# Patient Record
Sex: Female | Born: 1990 | Race: White | Hispanic: No | Marital: Married | Smoking: Former smoker
Health system: Southern US, Community
[De-identification: ages and names within clinical notes are randomized; demographics above are authoritative.]

## PROBLEM LIST (undated history)

## (undated) ENCOUNTER — Inpatient Hospital Stay (HOSPITAL_COMMUNITY): Payer: Self-pay

## (undated) DIAGNOSIS — Z973 Presence of spectacles and contact lenses: Secondary | ICD-10-CM

## (undated) DIAGNOSIS — F909 Attention-deficit hyperactivity disorder, unspecified type: Secondary | ICD-10-CM

## (undated) DIAGNOSIS — R222 Localized swelling, mass and lump, trunk: Secondary | ICD-10-CM

## (undated) DIAGNOSIS — F419 Anxiety disorder, unspecified: Secondary | ICD-10-CM

## (undated) DIAGNOSIS — Z789 Other specified health status: Secondary | ICD-10-CM

## (undated) DIAGNOSIS — F32A Depression, unspecified: Secondary | ICD-10-CM

## (undated) HISTORY — PX: NO PAST SURGERIES: SHX2092

---
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
## 2012-06-22 ENCOUNTER — Other Ambulatory Visit (HOSPITAL_COMMUNITY)
Admission: RE | Admit: 2012-06-22 | Discharge: 2012-06-22 | Disposition: A | Discharge status: Home / Self Care | Payer: Medicaid Other | Source: Ambulatory Visit | Attending: Obstetrics & Gynecology | Admitting: Obstetrics & Gynecology

## 2012-06-22 DIAGNOSIS — Z113 Encounter for screening for infections with a predominantly sexual mode of transmission: Secondary | ICD-10-CM | POA: Insufficient documentation

## 2012-06-22 DIAGNOSIS — Z3400 Encounter for supervision of normal first pregnancy, unspecified trimester: Secondary | ICD-10-CM | POA: Insufficient documentation

## 2012-06-22 DIAGNOSIS — Z124 Encounter for screening for malignant neoplasm of cervix: Secondary | ICD-10-CM | POA: Insufficient documentation

## 2012-06-27 ENCOUNTER — Other Ambulatory Visit: Payer: Self-pay

## 2012-08-16 ENCOUNTER — Other Ambulatory Visit (HOSPITAL_COMMUNITY): Payer: Self-pay | Admitting: Obstetrics & Gynecology

## 2012-08-16 ENCOUNTER — Ambulatory Visit (HOSPITAL_COMMUNITY)
Admission: RE | Admit: 2012-08-16 | Discharge: 2012-08-16 | Disposition: A | Discharge status: Home / Self Care | Payer: Medicaid Other | Source: Ambulatory Visit | Attending: Obstetrics & Gynecology | Admitting: Obstetrics & Gynecology

## 2012-08-16 DIAGNOSIS — K7689 Other specified diseases of liver: Secondary | ICD-10-CM | POA: Insufficient documentation

## 2012-08-16 DIAGNOSIS — K8020 Calculus of gallbladder without cholecystitis without obstruction: Secondary | ICD-10-CM

## 2012-08-16 DIAGNOSIS — R1011 Right upper quadrant pain: Secondary | ICD-10-CM

## 2012-09-21 ENCOUNTER — Encounter (HOSPITAL_COMMUNITY): Payer: Self-pay | Admitting: Anesthesiology

## 2012-09-21 ENCOUNTER — Inpatient Hospital Stay (HOSPITAL_COMMUNITY): Payer: Medicaid Other

## 2012-09-21 ENCOUNTER — Inpatient Hospital Stay (HOSPITAL_COMMUNITY)
Admission: AD | Admit: 2012-09-21 | Discharge: 2012-09-26 | DRG: 765 | Disposition: A | Discharge status: Home / Self Care | Payer: Medicaid Other | Source: Ambulatory Visit | Attending: Obstetrics & Gynecology | Admitting: Obstetrics & Gynecology

## 2012-09-21 ENCOUNTER — Encounter (HOSPITAL_COMMUNITY): Payer: Self-pay | Admitting: *Deleted

## 2012-09-21 DIAGNOSIS — O4290 Premature rupture of membranes, unspecified as to length of time between rupture and onset of labor, unspecified weeks of gestation: Secondary | ICD-10-CM | POA: Diagnosis present

## 2012-09-21 DIAGNOSIS — E669 Obesity, unspecified: Secondary | ICD-10-CM | POA: Diagnosis present

## 2012-09-21 DIAGNOSIS — O3430 Maternal care for cervical incompetence, unspecified trimester: Secondary | ICD-10-CM | POA: Diagnosis present

## 2012-09-21 DIAGNOSIS — O328XX0 Maternal care for other malpresentation of fetus, not applicable or unspecified: Principal | ICD-10-CM | POA: Diagnosis present

## 2012-09-21 DIAGNOSIS — O99214 Obesity complicating childbirth: Secondary | ICD-10-CM | POA: Diagnosis present

## 2012-09-21 HISTORY — DX: Other specified health status: Z78.9

## 2012-09-21 LAB — CBC
MCH: 29.5 pg (ref 26.0–34.0)
MCHC: 34 g/dL (ref 30.0–36.0)
MCV: 86.8 fL (ref 78.0–100.0)
Platelets: 233 10*3/uL (ref 150–400)
RDW: 13.4 % (ref 11.5–15.5)

## 2012-09-21 LAB — URINALYSIS, ROUTINE W REFLEX MICROSCOPIC
Glucose, UA: NEGATIVE mg/dL
Specific Gravity, Urine: 1.015 (ref 1.005–1.030)
pH: 6.5 (ref 5.0–8.0)

## 2012-09-21 LAB — URINE MICROSCOPIC-ADD ON

## 2012-09-21 LAB — POCT FERN TEST: Fern Test: NEGATIVE

## 2012-09-21 MED ORDER — ZOLPIDEM TARTRATE 5 MG PO TABS
5.0000 mg | ORAL_TABLET | Freq: Every evening | ORAL | Status: DC | PRN
  Administered 2012-09-21: 5 mg via ORAL
  Filled 2012-09-21: qty 1

## 2012-09-21 MED ORDER — DOCUSATE SODIUM 100 MG PO CAPS
100.0000 mg | ORAL_CAPSULE | Freq: Every day | ORAL | Status: DC
  Administered 2012-09-23 – 2012-09-26 (×4): 100 mg via ORAL
  Filled 2012-09-21 (×4): qty 1

## 2012-09-21 MED ORDER — BETAMETHASONE SOD PHOS & ACET 6 (3-3) MG/ML IJ SUSP
12.0000 mg | INTRAMUSCULAR | Status: AC
  Filled 2012-09-21: qty 2

## 2012-09-21 MED ORDER — PRENATAL MULTIVITAMIN CH: 1.0000 | ORAL_TABLET | Freq: Every day | ORAL | Status: DC

## 2012-09-21 MED ORDER — CEFAZOLIN SODIUM 1-5 GM-% IV SOLN
1.0000 g | Freq: Three times a day (TID) | INTRAVENOUS | Status: DC
  Administered 2012-09-22 – 2012-09-23 (×4): 1 g via INTRAVENOUS
  Filled 2012-09-21 (×7): qty 50

## 2012-09-21 MED ORDER — CEFAZOLIN SODIUM-DEXTROSE 2-3 GM-% IV SOLR: 2.0000 g | Freq: Once | INTRAVENOUS | Status: DC

## 2012-09-21 MED ORDER — ACETAMINOPHEN 325 MG PO TABS
650.0000 mg | ORAL_TABLET | ORAL | Status: DC | PRN
  Administered 2012-09-21 – 2012-09-22 (×2): 650 mg via ORAL
  Filled 2012-09-21 (×2): qty 2

## 2012-09-21 MED ORDER — CALCIUM CARBONATE ANTACID 500 MG PO CHEW
2.0000 | CHEWABLE_TABLET | ORAL | Status: DC | PRN
  Administered 2012-09-23: 400 mg via ORAL
  Filled 2012-09-21 (×2): qty 1

## 2012-09-21 MED ORDER — MAGNESIUM SULFATE BOLUS VIA INFUSION
6.0000 g | Freq: Once | INTRAVENOUS | Status: AC
  Administered 2012-09-21: 6 g via INTRAVENOUS
  Filled 2012-09-21: qty 500

## 2012-09-21 MED ORDER — BETAMETHASONE SOD PHOS & ACET 6 (3-3) MG/ML IJ SUSP
12.0000 mg | Freq: Once | INTRAMUSCULAR | Status: AC
  Administered 2012-09-21: 12 mg via INTRAMUSCULAR
  Filled 2012-09-21: qty 2

## 2012-09-21 MED ORDER — MAGNESIUM SULFATE 40 G IN LACTATED RINGERS - SIMPLE
2.0000 g/h | INTRAVENOUS | Status: DC
  Administered 2012-09-21 – 2012-09-22 (×2): 2 g/h via INTRAVENOUS
  Filled 2012-09-21 (×2): qty 500

## 2012-09-21 MED ORDER — LACTATED RINGERS IV SOLN
INTRAVENOUS | Status: DC
  Administered 2012-09-21 – 2012-09-22 (×2): via INTRAVENOUS
  Administered 2012-09-22: 100 mL/h via INTRAVENOUS
  Administered 2012-09-22 (×4): via INTRAVENOUS

## 2012-09-21 MED ORDER — CEFAZOLIN SODIUM-DEXTROSE 2-3 GM-% IV SOLR
2.0000 g | Freq: Once | INTRAVENOUS | Status: AC
  Administered 2012-09-21: 2 g via INTRAVENOUS
  Filled 2012-09-21: qty 50

## 2012-09-21 NOTE — H&P (Signed)
Wanda Mccann is a 21 y.o. female G1 and is 20 w 5d by [facility] by 11 week [place] of 01-14-12 after unsure LMP EDC of 2-14-14presenting for evaluation and treatment and called earlier that she had visited an ER yesterday evening after she fell on her buttocks and noticed no increase in pain or bleeding and states she has not had any decrease in fetal movement.  In MAU she has been evaluated per CNM and found to have membranes in the vagina.  The patient has had minimal to mild cramping.    The patient initially stated she would do all that she could for this baby.  This was in light of the possible need for a Cesarean section for abnormal FHR or ROM  And breech presentation  We did discuss positioning and probable poor outcome and use of magnesium and antibiotics and lung maturation steroids and neonatal consultation and MFM consultation and Dr Claudean Severance was consulted by phone.  Maternal Medical History:  Reason for admission: Reason for admission: contractions.  Reason for Admission:   nauseaContractions: Onset was 6-12 hours ago.   Frequency: irregular.   Perceived severity is mild.    Fetal activity: Perceived fetal activity is normal.   Last perceived fetal movement was within the past hour.    Prenatal complications: no prenatal complications No bleeding, cholelithiasis, HIV, hypertension, infection, IUGR, nephrolithiasis, oligohydramnios, placental abnormality, polyhydramnios, pre-eclampsia, preterm labor, substance abuse, thrombocytopenia or thrombophilia.   Prenatal Complications - Diabetes: none.    OB History    Grav Para Term Preterm Abortions TAB SAB Ect Mult Living   1              Past Medical History  Diagnosis Date  . No pertinent past medical history   headaches and obesity Past Surgical History  Procedure Date  . No past surgeries    Family History: family history is not on file. not pertinent to this but no h/o DVT PE or stroke Social History:  reports that she has  quit smoking. She does not have any smokeless tobacco history on file. She reports that she does not drink alcohol or use illicit drugs.   Prenatal Transfer Tool  Maternal Diabetes: No Genetic Screening: Normal Maternal Ultrasounds/Referrals: Normal Fetal Ultrasounds or other Referrals:  None Maternal Substance Abuse:  No Significant Maternal Medications:  None Significant Maternal Lab Results:  None Other Comments:  None  Review of Systems  Constitutional: Negative.  Negative for fever, chills, weight loss, malaise/fatigue and diaphoresis.  HENT: Negative.  Negative for congestion and sore throat.        Has had a h/o HA this pregnancy but not currently  Eyes: Negative.  Negative for blurred vision.  Respiratory: Negative.  Negative for cough, hemoptysis, sputum production, shortness of breath and wheezing.   Cardiovascular: Negative.  Negative for chest pain, palpitations and leg swelling.  Gastrointestinal: Positive for heartburn and abdominal pain. Negative for nausea, vomiting, diarrhea, constipation and blood in stool.  Genitourinary: Negative.  Negative for dysuria, urgency and flank pain.  Musculoskeletal: Positive for back pain.       The pain that she has been experiencing sounds like contractions that are mild occasionally radiating to back that comes and goes   Skin: Negative.  Negative for rash.  Neurological: Negative.  Negative for tremors, sensory change, speech change, focal weakness, seizures, loss of consciousness and weakness.  Endo/Heme/Allergies: Negative.   Psychiatric/Behavioral: Negative.       Blood pressure 142/75, pulse  91, temperature 98.8 F (37.1 C), temperature source Oral, resp. rate 20, height 5\' 9"  (1.753 m), weight 147.419 kg (325 lb). Maternal Exam:  Uterine Assessment: Contraction strength is mild.  Not evident by palpation or on the monitor maybe secondary to body habitus and maybe secondary to the fact that they are only mild     Physical  Exam  Constitutional: She appears distressed.       Mildly distressed and upset and in Trendelenberg positoning in MAU   HENT:  Head: Normocephalic and atraumatic.  Mouth/Throat: Oropharynx is clear and moist.  Eyes: Pupils are equal, round, and reactive to light. Right eye exhibits no discharge. Left eye exhibits no discharge.  Neck: Normal range of motion. Neck supple. No tracheal deviation present. No thyromegaly present.  Cardiovascular: Normal rate, regular rhythm and normal heart sounds.   Respiratory: Effort normal and breath sounds normal. No respiratory distress. She has no wheezes.  GI: Soft. Bowel sounds are normal. She exhibits no distension. There is tenderness. There is no rebound and no guarding.       Mild suprapubic tenderness and not acute and not more than mild  Genitourinary:       Pt had been examined and [place] was already performed with the finding of minimal cervix, membranes in the vagina and breech  Musculoskeletal: She exhibits no edema and no tenderness.  Lymphadenopathy:    She has no cervical adenopathy.  Neurological: She is alert. She has normal reflexes.  Skin: Skin is warm and dry. No rash noted. She is not diaphoretic. No erythema.  Psychiatric: She has a normal mood and affect. Her behavior is normal. Judgment and thought content normal.    Prenatal labs: ABO, Rh: --/--/A NEG, A NEG (10/17 1935) [name] postive Antibody: NEG (10/17 1935) Rubella:  Immune RPR:   negative HBsAg:   negative HIV:   negative GBS:   pending  Assessment/Plan: IUP at [redacted]w[redacted]d Cervical insufficiency Prolapsed membranes Morbid obesity   Plan: Steroids and magnesium and antibiotics and rest and trendelenberg for now I have been informed that a NICU bed would be available and that a L&D bed may not be available The patient initially stated she would do all that she could for this baby.  This was in light of the possible need for a Cesarean section for abnormal FHR or ROM  And  breech presentation  We did discuss positioning and probable poor outcome and use of magnesium and antibiotics and lung maturation steroids and neonatal consultation and MFM consultation and Dr Claudean Severance was consulted by phone.  Eller Sweis H. 09/21/2012, 9:44 PM

## 2012-09-21 NOTE — MAU Note (Signed)
Pt stated she fell in ht shower yesterday fell on her bottom. Reports had been progressively having lower abd cramping that is sharp. Reports normal fetal movement.

## 2012-09-21 NOTE — MAU Provider Note (Signed)
Chief Complaint:  Abdominal Pain   First Provider Initiated Contact with Patient 09/21/12 1745     HPI: Wanda Mccann is a 21 y.o. G1P0 at 54w5dwho presents to maternity admissions reporting worsening sharp cramping since falling in the shower yesterday. States she fell on her bottom. Denies leakage of fluid or vaginal bleeding. Good fetal movement.   Past Medical History: Past Medical History  Diagnosis Date  . No pertinent past medical history     Past obstetric history: OB History    Grav Para Term Preterm Abortions TAB SAB Ect Mult Living   1              # Outc Date GA Lbr Len/2nd Wgt Sex Del Anes PTL Lv   1 CUR               Past Surgical History: Past Surgical History  Procedure Date  . No past surgeries     Family History: No family history on file.  Social History: History  Substance Use Topics  . Smoking status: Former Games developer  . Smokeless tobacco: Not on file  . Alcohol Use: No    Allergies:  Allergies  Allergen Reactions  . Penicillins Rash    Meds:  Prescriptions prior to admission  Medication Sig Dispense Refill  . acetaminophen (TYLENOL) 500 MG tablet Take 500 mg by mouth every 6 (six) hours as needed. Takes for pain      . Prenatal Vit-Fe Fumarate-FA (PRENATAL MULTIVITAMIN) TABS Take 1 tablet by mouth every morning.        ROS: Pertinent findings in history of present illness.  Physical Exam  Blood pressure 145/78, pulse 104, temperature 98.9 F (37.2 C), temperature source Oral, resp. rate 18, height 5\' 9"  (1.753 m).  GENERAL: Well-developed, well-nourished female in no acute distress.  HEENT: normocephalic HEART: normal rate RESP: normal effort ABDOMEN: Soft, non-tender, gravid appropriate for gestational age EXTREMITIES: Nontender, no edema NEURO: alert and oriented SPECULUM EXAM: NEFG, moderate amount of frothy, mucoid yellow discharge. BBOW seen. No bleeding.  BBOW felt. No cervix felt.   FHT:  Baseline 140 , moderate  variability, accelerations present, no decelerations Contractions: none   Labs: Results for orders placed during the hospital encounter of 09/21/12 (from the past 24 hour(s))  URINALYSIS, ROUTINE W REFLEX MICROSCOPIC     Status: Abnormal   Collection Time   09/21/12  5:15 PM      Component Value Range   Color, Urine YELLOW  YELLOW   APPearance CLEAR  CLEAR   Specific Gravity, Urine 1.015  1.005 - 1.030   pH 6.5  5.0 - 8.0   Glucose, UA NEGATIVE  NEGATIVE mg/dL   Hgb urine dipstick NEGATIVE  NEGATIVE   Bilirubin Urine NEGATIVE  NEGATIVE   Ketones, ur NEGATIVE  NEGATIVE mg/dL   Protein, ur NEGATIVE  NEGATIVE mg/dL   Urobilinogen, UA 0.2  0.0 - 1.0 mg/dL   Nitrite NEGATIVE  NEGATIVE   Leukocytes, UA LARGE (*) NEGATIVE  URINE MICROSCOPIC-ADD ON     Status: Abnormal   Collection Time   09/21/12  5:15 PM      Component Value Range   Squamous Epithelial / LPF MANY (*) RARE   WBC, UA 11-20  <3 WBC/hpf   Bacteria, UA FEW (*) RARE   Urine-Other AMORPHOUS URATES/PHOSPHATES    WET PREP, GENITAL     Status: Abnormal   Collection Time   09/21/12  5:50 PM      Component  Value Range   Yeast Wet Prep HPF POC NONE SEEN  NONE SEEN   Trich, Wet Prep NONE SEEN  NONE SEEN   Clue Cells Wet Prep HPF POC NONE SEEN  NONE SEEN   WBC, Wet Prep HPF POC FEW (*) NONE SEEN    Imaging:  No CL, membranes bulging into vagina, footling breech. EFW 1-14, measuring 26.1 weeks, normal fluid.  ED Course 1815: Notified Dr. Christell Constant of BBOW on exam, orders for BMZ, Magnesium Sulfate, BS [place]. Order Ancef. Will be here to see pt in ~1 hour. NICU notified of pt exam. 1840: Dr. Christell Constant at [facility]. By prelim [place] reports baby measuring 26 weeks, ~2 lb. BMZ #1 given. RN's trying to start IV. Will call Nurse Anesthetist.   Assessment: 1. Preterm labor without delivery in second trimester   2. Incompetent cervix in pregnancy, antepartum     Plan: Admit to Antenatal. Ancef, Magnesium Sulfate, NICU Consult, CBC, RPR,  T&S. Anesthesia and NICU notified.   [place], CNM 09/21/2012 7:23 PM

## 2012-09-22 ENCOUNTER — Inpatient Hospital Stay (HOSPITAL_COMMUNITY): Payer: Medicaid Other | Admitting: Anesthesiology

## 2012-09-22 ENCOUNTER — Encounter (HOSPITAL_COMMUNITY): Payer: Self-pay | Admitting: Anesthesiology

## 2012-09-22 ENCOUNTER — Inpatient Hospital Stay (HOSPITAL_COMMUNITY)
Admit: 2012-09-22 | Discharge: 2012-09-22 | Disposition: A | Discharge status: Home / Self Care | Payer: Medicaid Other | Attending: Obstetrics & Gynecology | Admitting: Obstetrics & Gynecology

## 2012-09-22 ENCOUNTER — Inpatient Hospital Stay (HOSPITAL_COMMUNITY): Payer: Medicaid Other

## 2012-09-22 ENCOUNTER — Encounter (HOSPITAL_COMMUNITY)
Admission: AD | Disposition: A | Discharge status: Home / Self Care | Payer: Self-pay | Source: Ambulatory Visit | Attending: Obstetrics & Gynecology

## 2012-09-22 ENCOUNTER — Encounter (HOSPITAL_COMMUNITY): Payer: Self-pay | Admitting: *Deleted

## 2012-09-22 LAB — RPR: RPR Ser Ql: NONREACTIVE

## 2012-09-22 LAB — GC/CHLAMYDIA PROBE AMP, GENITAL: Chlamydia, DNA Probe: NEGATIVE

## 2012-09-22 SURGERY — Surgical Case
Anesthesia: Spinal | Site: Abdomen | Wound class: Clean Contaminated

## 2012-09-22 MED ORDER — BUPIVACAINE HCL 0.5 % IJ SOLN
INTRAMUSCULAR | Status: DC | PRN
  Administered 2012-09-22: 30 mL

## 2012-09-22 MED ORDER — MORPHINE SULFATE (PF) 0.5 MG/ML IJ SOLN
INTRAMUSCULAR | Status: DC | PRN
  Administered 2012-09-22: .2 mg via INTRATHECAL

## 2012-09-22 MED ORDER — KETOROLAC TROMETHAMINE 30 MG/ML IJ SOLN: 30.0000 mg | Freq: Four times a day (QID) | INTRAMUSCULAR | Status: AC | PRN

## 2012-09-22 MED ORDER — HYDROMORPHONE HCL PF 1 MG/ML IJ SOLN
INTRAMUSCULAR | Status: AC
  Filled 2012-09-22: qty 1

## 2012-09-22 MED ORDER — OXYTOCIN 10 UNIT/ML IJ SOLN
INTRAMUSCULAR | Status: AC
  Filled 2012-09-22: qty 1

## 2012-09-22 MED ORDER — MAGNESIUM HYDROXIDE 400 MG/5ML PO SUSP: 30.0000 mL | ORAL | Status: DC | PRN

## 2012-09-22 MED ORDER — MEPERIDINE HCL 25 MG/ML IJ SOLN: 6.2500 mg | INTRAMUSCULAR | Status: DC | PRN

## 2012-09-22 MED ORDER — FENTANYL CITRATE 0.05 MG/ML IJ SOLN
INTRAMUSCULAR | Status: DC | PRN
  Administered 2012-09-22: 12.5 ug via INTRATHECAL

## 2012-09-22 MED ORDER — SODIUM CHLORIDE 0.9 % IJ SOLN: 3.0000 mL | INTRAMUSCULAR | Status: DC | PRN

## 2012-09-22 MED ORDER — FENTANYL CITRATE 0.05 MG/ML IJ SOLN
INTRAMUSCULAR | Status: AC
  Filled 2012-09-22: qty 2

## 2012-09-22 MED ORDER — FENTANYL CITRATE 0.05 MG/ML IJ SOLN
INTRAMUSCULAR | Status: DC | PRN
  Administered 2012-09-22: 87.5 ug via INTRAVENOUS
  Administered 2012-09-22: 50 ug via INTRAVENOUS
  Administered 2012-09-22 (×5): 100 ug via INTRAVENOUS

## 2012-09-22 MED ORDER — MENTHOL 3 MG MT LOZG: 1.0000 | LOZENGE | OROMUCOSAL | Status: DC | PRN

## 2012-09-22 MED ORDER — PRENATAL MULTIVITAMIN CH
1.0000 | ORAL_TABLET | Freq: Every day | ORAL | Status: DC
  Administered 2012-09-23 – 2012-09-26 (×4): 1 via ORAL
  Filled 2012-09-22 (×4): qty 1

## 2012-09-22 MED ORDER — SCOPOLAMINE 1 MG/3DAYS TD PT72
MEDICATED_PATCH | TRANSDERMAL | Status: AC
  Filled 2012-09-22: qty 1

## 2012-09-22 MED ORDER — SIMETHICONE 80 MG PO CHEW: 80.0000 mg | CHEWABLE_TABLET | ORAL | Status: DC | PRN

## 2012-09-22 MED ORDER — ONDANSETRON HCL 4 MG/2ML IJ SOLN
INTRAMUSCULAR | Status: AC
  Filled 2012-09-22: qty 2

## 2012-09-22 MED ORDER — TETANUS-DIPHTH-ACELL PERTUSSIS 5-2.5-18.5 LF-MCG/0.5 IM SUSP
0.5000 mL | Freq: Once | INTRAMUSCULAR | Status: AC
  Administered 2012-09-25: 0.5 mL via INTRAMUSCULAR
  Filled 2012-09-22: qty 0.5

## 2012-09-22 MED ORDER — MIDAZOLAM HCL 5 MG/5ML IJ SOLN
INTRAMUSCULAR | Status: DC | PRN
  Administered 2012-09-22: 2 mg via INTRAVENOUS

## 2012-09-22 MED ORDER — ONDANSETRON HCL 4 MG/2ML IJ SOLN: 4.0000 mg | INTRAMUSCULAR | Status: DC | PRN

## 2012-09-22 MED ORDER — NALBUPHINE HCL 10 MG/ML IJ SOLN: 5.0000 mg | INTRAMUSCULAR | Status: DC | PRN

## 2012-09-22 MED ORDER — HYDROMORPHONE HCL PF 1 MG/ML IJ SOLN: 0.2500 mg | INTRAMUSCULAR | Status: DC | PRN

## 2012-09-22 MED ORDER — MORPHINE SULFATE 0.5 MG/ML IJ SOLN
INTRAMUSCULAR | Status: AC
  Filled 2012-09-22: qty 10

## 2012-09-22 MED ORDER — DIPHENHYDRAMINE HCL 50 MG/ML IJ SOLN: 25.0000 mg | INTRAMUSCULAR | Status: DC | PRN

## 2012-09-22 MED ORDER — CITRIC ACID-SODIUM CITRATE 334-500 MG/5ML PO SOLN
ORAL | Status: AC
  Administered 2012-09-22: 30 mL
  Filled 2012-09-22: qty 15

## 2012-09-22 MED ORDER — OXYTOCIN 40 UNITS IN LACTATED RINGERS INFUSION - SIMPLE MED: 62.5000 mL/h | INTRAVENOUS | Status: AC

## 2012-09-22 MED ORDER — KETAMINE HCL 100 MG/ML IJ SOLN
INTRAMUSCULAR | Status: AC
  Filled 2012-09-22: qty 1

## 2012-09-22 MED ORDER — OXYCODONE-ACETAMINOPHEN 5-325 MG PO TABS
1.0000 | ORAL_TABLET | ORAL | Status: DC | PRN
  Administered 2012-09-23 – 2012-09-24 (×3): 2 via ORAL
  Administered 2012-09-24: 1 via ORAL
  Administered 2012-09-25 – 2012-09-26 (×2): 2 via ORAL
  Filled 2012-09-22: qty 2
  Filled 2012-09-22: qty 1
  Filled 2012-09-22 (×4): qty 2

## 2012-09-22 MED ORDER — KETOROLAC TROMETHAMINE 30 MG/ML IJ SOLN: 15.0000 mg | Freq: Once | INTRAMUSCULAR | Status: DC | PRN

## 2012-09-22 MED ORDER — BUPIVACAINE IN DEXTROSE 0.75-8.25 % IT SOLN
INTRATHECAL | Status: DC | PRN
  Administered 2012-09-22: 1.6 mL via INTRATHECAL

## 2012-09-22 MED ORDER — FENTANYL CITRATE 0.05 MG/ML IJ SOLN
INTRAMUSCULAR | Status: AC
  Filled 2012-09-22: qty 5

## 2012-09-22 MED ORDER — KETAMINE HCL 10 MG/ML IJ SOLN
INTRAMUSCULAR | Status: DC | PRN
Start: 2012-09-22 — End: 2012-09-22
  Administered 2012-09-22: 30 mg via INTRAVENOUS
  Administered 2012-09-22 (×2): 20 mg via INTRAVENOUS
  Administered 2012-09-22: 30 mg via INTRAVENOUS
  Administered 2012-09-22: 20 mg via INTRAVENOUS

## 2012-09-22 MED ORDER — KETOROLAC TROMETHAMINE 60 MG/2ML IM SOLN: 60.0000 mg | Freq: Once | INTRAMUSCULAR | Status: AC | PRN

## 2012-09-22 MED ORDER — NALOXONE HCL 0.4 MG/ML IJ SOLN: 0.4000 mg | INTRAMUSCULAR | Status: DC | PRN

## 2012-09-22 MED ORDER — DIBUCAINE 1 % RE OINT: 1.0000 "application " | TOPICAL_OINTMENT | RECTAL | Status: DC | PRN

## 2012-09-22 MED ORDER — SCOPOLAMINE 1 MG/3DAYS TD PT72
1.0000 | MEDICATED_PATCH | Freq: Once | TRANSDERMAL | Status: AC
  Administered 2012-09-22: 1.5 mg via TRANSDERMAL

## 2012-09-22 MED ORDER — METOCLOPRAMIDE HCL 5 MG/ML IJ SOLN
INTRAMUSCULAR | Status: AC
  Administered 2012-09-22: 10 mg via INTRAVENOUS
  Filled 2012-09-22: qty 2

## 2012-09-22 MED ORDER — IBUPROFEN 600 MG PO TABS
600.0000 mg | ORAL_TABLET | Freq: Four times a day (QID) | ORAL | Status: DC
  Administered 2012-09-23 – 2012-09-26 (×13): 600 mg via ORAL
  Filled 2012-09-22 (×13): qty 1

## 2012-09-22 MED ORDER — MORPHINE SULFATE (PF) 0.5 MG/ML IJ SOLN
INTRAMUSCULAR | Status: DC | PRN
  Administered 2012-09-22: 1 mg via INTRAVENOUS
  Administered 2012-09-22: .8 mg via EPIDURAL
  Administered 2012-09-22: 1 mg via EPIDURAL
  Administered 2012-09-22 (×2): 1 mg via INTRAVENOUS

## 2012-09-22 MED ORDER — OXYTOCIN 10 UNIT/ML IJ SOLN
40.0000 [IU] | INTRAVENOUS | Status: DC | PRN
  Administered 2012-09-22: 40 [IU] via INTRAVENOUS

## 2012-09-22 MED ORDER — EPHEDRINE SULFATE 50 MG/ML IJ SOLN
INTRAMUSCULAR | Status: DC | PRN
  Administered 2012-09-22: 10 mg via INTRAVENOUS

## 2012-09-22 MED ORDER — DIPHENHYDRAMINE HCL 25 MG PO CAPS
25.0000 mg | ORAL_CAPSULE | ORAL | Status: DC | PRN
  Administered 2012-09-24 (×2): 25 mg via ORAL
  Filled 2012-09-22: qty 1

## 2012-09-22 MED ORDER — ONDANSETRON HCL 4 MG/2ML IJ SOLN
4.0000 mg | Freq: Three times a day (TID) | INTRAMUSCULAR | Status: DC | PRN
  Administered 2012-09-22: 4 mg via INTRAVENOUS
  Filled 2012-09-22: qty 2

## 2012-09-22 MED ORDER — SIMETHICONE 80 MG PO CHEW
80.0000 mg | CHEWABLE_TABLET | Freq: Three times a day (TID) | ORAL | Status: DC
  Administered 2012-09-23 – 2012-09-26 (×13): 80 mg via ORAL

## 2012-09-22 MED ORDER — WITCH HAZEL-GLYCERIN EX PADS: 1.0000 "application " | MEDICATED_PAD | CUTANEOUS | Status: DC | PRN

## 2012-09-22 MED ORDER — MIDAZOLAM HCL 2 MG/2ML IJ SOLN
INTRAMUSCULAR | Status: AC
  Filled 2012-09-22: qty 2

## 2012-09-22 MED ORDER — PHENYLEPHRINE HCL 10 MG/ML IJ SOLN
INTRAMUSCULAR | Status: DC | PRN
  Administered 2012-09-22: 80 ug via INTRAVENOUS
  Administered 2012-09-22 (×3): 40 ug via INTRAVENOUS

## 2012-09-22 MED ORDER — PROMETHAZINE HCL 25 MG/ML IJ SOLN: 6.2500 mg | INTRAMUSCULAR | Status: DC | PRN

## 2012-09-22 MED ORDER — ONDANSETRON HCL 4 MG PO TABS: 4.0000 mg | ORAL_TABLET | ORAL | Status: DC | PRN

## 2012-09-22 MED ORDER — DIPHENHYDRAMINE HCL 25 MG PO CAPS
25.0000 mg | ORAL_CAPSULE | Freq: Four times a day (QID) | ORAL | Status: DC | PRN
  Filled 2012-09-22: qty 1

## 2012-09-22 MED ORDER — ONDANSETRON HCL 4 MG/2ML IJ SOLN
INTRAMUSCULAR | Status: DC | PRN
  Administered 2012-09-22: 4 mg via INTRAVENOUS

## 2012-09-22 MED ORDER — LACTATED RINGERS IV SOLN
INTRAVENOUS | Status: DC
  Administered 2012-09-23 (×2): via INTRAVENOUS

## 2012-09-22 MED ORDER — SENNOSIDES-DOCUSATE SODIUM 8.6-50 MG PO TABS
2.0000 | ORAL_TABLET | Freq: Every day | ORAL | Status: DC
  Administered 2012-09-23 – 2012-09-25 (×3): 2 via ORAL

## 2012-09-22 MED ORDER — LACTATED RINGERS IV SOLN
INTRAVENOUS | Status: DC | PRN
  Administered 2012-09-22: 17:00:00 via INTRAVENOUS

## 2012-09-22 MED ORDER — LANOLIN HYDROUS EX OINT: 1.0000 "application " | TOPICAL_OINTMENT | CUTANEOUS | Status: DC | PRN

## 2012-09-22 MED ORDER — SODIUM CHLORIDE 0.9 % IV SOLN: 1.0000 ug/kg/h | INTRAVENOUS | Status: DC | PRN

## 2012-09-22 MED ORDER — METOCLOPRAMIDE HCL 5 MG/ML IJ SOLN
10.0000 mg | Freq: Three times a day (TID) | INTRAMUSCULAR | Status: DC | PRN
  Administered 2012-09-22: 10 mg via INTRAVENOUS

## 2012-09-22 MED ORDER — LIDOCAINE 5 % EX OINT
TOPICAL_OINTMENT | Freq: Three times a day (TID) | CUTANEOUS | Status: DC | PRN
  Administered 2012-09-22: 15:00:00 via TOPICAL
  Filled 2012-09-22: qty 35.44

## 2012-09-22 MED ORDER — HYDROMORPHONE HCL PF 1 MG/ML IJ SOLN
INTRAMUSCULAR | Status: DC | PRN
  Administered 2012-09-22 (×3): 1 mg via INTRAVENOUS

## 2012-09-22 MED ORDER — ZOLPIDEM TARTRATE 5 MG PO TABS: 5.0000 mg | ORAL_TABLET | Freq: Every evening | ORAL | Status: DC | PRN

## 2012-09-22 MED ORDER — DIPHENHYDRAMINE HCL 50 MG/ML IJ SOLN: 12.5000 mg | INTRAMUSCULAR | Status: DC | PRN

## 2012-09-22 SURGICAL SUPPLY — 43 items
APL SKNCLS STERI-STRIP NONHPOA (GAUZE/BANDAGES/DRESSINGS) ×1
BARRIER ADHS 3X4 INTERCEED (GAUZE/BANDAGES/DRESSINGS) ×1 IMPLANT
BENZOIN TINCTURE PRP APPL 2/3 (GAUZE/BANDAGES/DRESSINGS) ×2 IMPLANT
BRR ADH 4X3 ABS CNTRL BYND (GAUZE/BANDAGES/DRESSINGS) ×1
CLOTH BEACON ORANGE TIMEOUT ST (SAFETY) ×2 IMPLANT
DRAPE SURG 17X23 STRL (DRAPES) ×2 IMPLANT
DRESSING TELFA 8X3 (GAUZE/BANDAGES/DRESSINGS) ×2 IMPLANT
DRSG COVADERM 4X10 (GAUZE/BANDAGES/DRESSINGS) IMPLANT
DURAPREP 26ML APPLICATOR (WOUND CARE) ×2 IMPLANT
ELECT REM PT RETURN 9FT ADLT (ELECTROSURGICAL) ×2
ELECTRODE REM PT RTRN 9FT ADLT (ELECTROSURGICAL) ×1 IMPLANT
EXTRACTOR VACUUM M CUP 4 TUBE (SUCTIONS) IMPLANT
GLOVE BIO SURGEON STRL SZ 6.5 (GLOVE) ×2 IMPLANT
GLOVE BIO SURGEON STRL SZ7.5 (GLOVE) ×2 IMPLANT
GLOVE BIOGEL PI IND STRL 7.0 (GLOVE) IMPLANT
GLOVE BIOGEL PI IND STRL 8 (GLOVE) ×1 IMPLANT
GLOVE BIOGEL PI INDICATOR 7.0 (GLOVE)
GLOVE BIOGEL PI INDICATOR 8 (GLOVE) ×1
GOWN PREVENTION PLUS LG XLONG (DISPOSABLE) ×2 IMPLANT
GOWN PREVENTION PLUS XLARGE (GOWN DISPOSABLE) ×1 IMPLANT
GOWN STRL REIN XL XLG (GOWN DISPOSABLE) ×2 IMPLANT
KIT ABG SYR 3ML LUER SLIP (SYRINGE) ×1 IMPLANT
NDL HYPO 25X5/8 SAFETYGLIDE (NEEDLE) IMPLANT
NEEDLE HYPO 25X5/8 SAFETYGLIDE (NEEDLE) ×2 IMPLANT
NS IRRIG 1000ML POUR BTL (IV SOLUTION) ×2 IMPLANT
PACK C SECTION WH (CUSTOM PROCEDURE TRAY) ×2 IMPLANT
PAD ABD 7.5X8 STRL (GAUZE/BANDAGES/DRESSINGS) ×3 IMPLANT
PAD OB MATERNITY 4.3X12.25 (PERSONAL CARE ITEMS) IMPLANT
SLEEVE SCD COMPRESS KNEE MED (MISCELLANEOUS) ×1 IMPLANT
SPONGE GAUZE 4X4 12PLY (GAUZE/BANDAGES/DRESSINGS) ×2 IMPLANT
SPONGE LAP 18X18 X RAY DECT (DISPOSABLE) ×1 IMPLANT
STRIP CLOSURE SKIN 1/2X4 (GAUZE/BANDAGES/DRESSINGS) ×3 IMPLANT
SUT GUT PLAIN 0 CT-3 TAN 27 (SUTURE) IMPLANT
SUT MON AB 4-0 PS1 27 (SUTURE) ×2 IMPLANT
SUT PLAIN 2 0 XLH (SUTURE) ×2 IMPLANT
SUT VIC AB 0 CT1 36 (SUTURE) ×8 IMPLANT
SUT VIC AB 4-0 KS 27 (SUTURE) ×1 IMPLANT
SUT VICRYL 3 0 RAPIDE (SUTURE) ×4 IMPLANT
SWABSTICK BENZOIN STERILE (MISCELLANEOUS) ×1 IMPLANT
TAPE CLOTH SURG 4X10 WHT LF (GAUZE/BANDAGES/DRESSINGS) ×2 IMPLANT
TOWEL OR 17X24 6PK STRL BLUE (TOWEL DISPOSABLE) ×4 IMPLANT
TRAY FOLEY CATH 14FR (SET/KITS/TRAYS/PACK) IMPLANT
WATER STERILE IRR 1000ML POUR (IV SOLUTION) ×2 IMPLANT

## 2012-09-22 NOTE — Consult Note (Signed)
MFM consult  21 yr old G1P0 at [redacted]w[redacted]d with preterm labor vs cervical insufficiency, advanced cervical dilation, and prolapsing membranes referred by Dr. Moore for ultrasound and consult.    Ultrasound today shows: single intrauterine pregnancy. Right lateral placenta without evidence of previa. Normal amniotic fluid volume. The cervix appears 4.4cm dilated on abdominal ultrasound with membranes prolapsing into the vagina.  The fetus is in footling breech presentation. There is bilateral pyelectasis; the left renal pelvis measures 4.8mm; the right measures 4.4mm. The remainder of the limited anatomy survey is normal. Anatomy was limited by fetal position and maternal body habitus.    I discussed the finding of the ultrasound with the patient and counseled her as follows: 1. Fetal growth was performed yesterday and fetus measured 862g.  2. Normal limited anatomy survey.  3. Preterm labor:  Discussed that cervical dilation at this gestational age places the patient at significant risk of PPROM, preterm labor, and preterm delivery.  Discussed that babies born at this gestational age have an approximately 60% survival rate with a significant portion of survivors having major morbidity including cerebral palsy, intraventricular hemorrhage, eye damage, infection, and lung disease.  Patient met with NICU team and was counseled on outcomes for fetuses at this gestational age.   Patient has received one dose of betamethasone and is due for the second dose this evening. Patient has been on magnesium sulfate for neuroprotection. Discussed benefits of betamethasone and magnesium sulfate in reducing morbidity of prematurity and lowering the risk of cerebral palsy. Discussed that fetus is in footling breech presentation at this time. I discussed the increased risk of fetal morbidity and mortality with vaginal breech delivery of preterm fetus <1500gm.  Discussed risks/benefits of C section including risks of  bleeding, infection, damage to surrounding organs, that this would likely need to be a classical C section and that would require future pregnancies to be delivered via C section at 36-[redacted] weeks gestation and increases the risk for placenta previa and accreta  At this point given the above counseling patient has decided she would prefer a C section for distress. She is undecided about C section for labor and malpresentation but is leaning toward C section for malpresentation and labor. She will discuss with her husband and let the team know their decision.  - recommend close surveillance for development of signs/symptoms of chorioamnionitis or PPROM  - at this point of my consult around 13:00 the patient was painfully contracting at times and had contractions every 5 minutes therefore I recommend continue NPO and magnesium sulfate until stable - if contractions stop and patient remains stable can give regular diet and discontinue magnesium sulfate- would recommend restarting magnesium sulfate if delivery appears imminent and it has been at least 6 hours since magnesium was discontinued - recommend complete course of betamethasone - if magnesium is discontinued and patient begins contracting can consider a tocolytic; or can continue magnesium sulfate for tocolysis although its efficacy is limited - recommend continue ancef as GBS status is unknown and patient is contracting at this time with exposed membranes; may discontinue in several days if patient is stable - recommend inpatient management at this time - do not feel patient needs to be in trendelenburg but would continue bedrest - patient has a foley at this time as she is on magnesium sulfate; may discontinue foley when off of magnesium - recommend continuous fetal monitoring while on magnesium sulfate as patient would like C section for distress; if tracing is reassuring may   to TID monitoring when off of magnesium sulfate  Briefly discussed  future pregnancies: discussed difficult to determine if this is preterm labor or cervical insufficiency. Given patient had minimal symptoms to achieve cervical dilation I feel she may be a candidate for prophylactic cerclage in her next pregnancy. Discussed other options include 17OH progesterone which has been shown to reduce recurrence of preterm delivery by approximately 40% and option of cervical length surveillance. Recommend at the beginning of her next pregnancy a consult with Maternal Fetal Medicine to discuss options.  4. Pyelectasis: - not discussed today as patient was overwhelmed with conversation about preterm delivery - recommend continue to follow on ultrasound and if does not resolve will need postnatal follow up; inform Pediatricians at time of delivery 5. Fetus was measuring large for dates on ultrasound yesterday - patient has family history of diabetes - did not have early one hour - has hemoglobin A1C ordered for tomorrow - recommend check glucola in 1 week (as just received betamethasone) 6. Recommend SCDs as patient is at increased risk of venous thromboembolism with prolonged bedrest 7. Recommend continuous tocometer at this time 8. Recommend if patient is in labor to check fetal position as patient desires vaginal delivery if fetus is vertex; discussed may be small increase in adverse outcomes with vaginal delivery of extreme premature infants but risk is relatively similar to a C section and vaginal delivery would be preferred 9. If has labor or PPROM recommend sterile evaluation of cervix and if desires C section for malpresentation would recommend prior to fetal descent into the vagina  All of the patient's questions were answered. Discussed with Dr. Moore. I spent 45 minutes in face to face consultation with the patient in addition to time spent on the ultrasound.  Neeta Storey, MD  

## 2012-09-22 NOTE — Progress Notes (Signed)
Patient ID: Wanda Mccann, female   DOB: 1991-10-25, 21 y.o.   MRN: [id number] Hospital Day: 2  S: Preterm labor symptoms: states she had some feeling of fluid leaking and that she is unsure if it continued - amnisure was negative, baby movement is present  We had a long discussion regarding the clinical situation of prematurity and cesarean section and classical and that the best route of delivery would be CSx and probably classical.  This is not a good clinical situation for the baby or for the family and we are awaiting neonatalogy and MFM input at this time  The case has been discussed with both and the patient is aware that this information will not change the fact that the best situation would be to prolong the pregnancy and the best route of delivery would be CSx if delivery needed.  The patient has told me she would like to not have a CSx if possible but that she would do this if this is required if the FHR would change or becoming concerning  O: Blood pressure 131/64, pulse 114, temperature 97.8 F (36.6 C), temperature source Oral, resp. rate 18, height 5\' 9"  (1.753 m), weight 147.419 kg (325 lb).   EXB:MWUXLKGM: 150s  bpm Toco: the patient reports some cramping and contractions SVE: deferred  Scheduled Meds:   . betamethasone acetate-betamethasone sodium phosphate  12 mg Intramuscular Once  . betamethasone acetate-betamethasone sodium phosphate  12 mg Intramuscular Q24H  .  ceFAZolin (ANCEF) IV  1 g Intravenous Q8H  .  ceFAZolin (ANCEF) IV  2 g Intravenous Once  . docusate sodium  100 mg Oral Daily  . magnesium  6 g Intravenous Once  . prenatal multivitamin  1 tablet Oral Daily  . DISCONTD:  ceFAZolin (ANCEF) IV  2 g Intravenous Once   Continuous Infusions:   . lactated ringers 100 mL/hr at 09/22/12 0546  . magnesium sulfate 40 grams in LR 500 mL 2 g/hr (09/21/12 1916)   PRN Meds:.acetaminophen, calcium carbonate, zolpidem  CBC    Component Value Date/Time   WBC  15.5* 09/21/2012 1935   RBC 4.24 09/21/2012 1935   HGB 12.5 09/21/2012 1935   HCT 36.8 09/21/2012 1935   PLT 233 09/21/2012 1935   MCV 86.8 09/21/2012 1935   MCH 29.5 09/21/2012 1935   MCHC 34.0 09/21/2012 1935   RDW 13.4 09/21/2012 1935    US revealed no appreciable cervical length and 1#8 and footling breech and hourglass membranes     GBS pending amnisure negative Foley in place  Trendelenberg Gen: appears to be appropriate and in no distress ABD: uterus not tender and nonacute Perineum with mucous but not grossly ruptured Ext NT no edema A/P- 21 y.o. admitted with: IUP now 23w6 by [place] EDC 01-13-13 ( done at 11 weeks) Prolapsed membranes  FHR reassuring at this time   Patient Active Hospital Problem List: No active hospital problems.   Pregnancy Complications: prolapsed membranes in the vagina - cervical insufficiency  Preterm labor management: magnesium and bedrest Dating:  [redacted]w[redacted]d   WNU:[id number] insufficiency - MFM states cerclage late in pregnancy not viable option at this point ROD: C-section with labor classical

## 2012-09-22 NOTE — Consult Note (Signed)
Antenatal Consult Note   09/22/2012  3:19 PM  Requested by Dr. Christell Constant to consult with [name] who is at 30 6/[redacted] weeks gestation.  Ms. Wanda Mccann is a 21 y.o. female Prmigravida and is 23 weeks 6/7days by [facility] by 11 week [place] of 01-14-12 after unsure LMP EDC of 01-19-13.   She presented for evaluation and treatment yesterday evening after she fell on her buttocks and noticed no increase in pain or bleeding and states she has not had any decrease in fetal movement.  She was admitted completely dilated with hour-glass membranes and footling breech presentation.   She received her first dose of BMZ last night and presently on MgSO4 and Ancef.   I spoke to [name], [place] and her parents in Room 158. Discussed high mortality and morbidity at [redacted] weeks gestation. I discussed delivery and resuscitation. Discussed general immaturity of babies at this gestation particularly with pulmonary developmentt which means the baby will need prolonged vent support with high likelihood of chronic lung disease. Also discussed other organ immaturity such as GI necessitating use of HAL, IVH and based on gestation, infant's high risk for developmental issues later on. We also talked about LOS and marked difference in outcome of infant with increased maturation associated with decreased morbidity and decreased LOS.   I also discussed benefits of breastfeeding. Also informed the [name] regarding taking a tour of the NICU.   Thank you for inviting [place] to meet [name] before delivery.   Face to face time: 30 min.      Chales Abrahams V.T. Jarely Juncaj, MD Neonatologist

## 2012-09-22 NOTE — Progress Notes (Signed)
MFM consult  21 yr old G1P0 at [redacted]w[redacted]d with preterm labor vs cervical insufficiency, advanced cervical dilation, and prolapsing membranes referred by Dr. Christell Constant for ultrasound and consult.    Ultrasound today shows: single intrauterine pregnancy. Right lateral placenta without evidence of previa. Normal amniotic fluid volume. The cervix appears 4.4cm dilated on abdominal ultrasound with membranes prolapsing into the vagina.  The fetus is in footling breech presentation. There is bilateral pyelectasis; the left renal pelvis measures 4.26mm; the right measures 4.52mm. The remainder of the limited anatomy survey is normal. Anatomy was limited by fetal position and maternal body habitus.    I discussed the finding of the ultrasound with the patient and counseled her as follows: 1. Fetal growth was performed yesterday and fetus measured 862g.  2. Normal limited anatomy survey.  3. Preterm labor:  Discussed that cervical dilation at this gestational age places the patient at significant risk of PPROM, preterm labor, and preterm delivery.  Discussed that babies born at this gestational age have an approximately 60% survival rate with a significant portion of survivors having major morbidity including cerebral palsy, intraventricular hemorrhage, eye damage, infection, and lung disease.  Patient met with NICU team and was counseled on outcomes for fetuses at this gestational age.   Patient has received one dose of betamethasone and is due for the second dose this evening. Patient has been on magnesium sulfate for neuroprotection. Discussed benefits of betamethasone and magnesium sulfate in reducing morbidity of prematurity and lowering the risk of cerebral palsy. Discussed that fetus is in footling breech presentation at this time. I discussed the increased risk of fetal morbidity and mortality with vaginal breech delivery of preterm fetus <1500gm.  Discussed risks/benefits of C section including risks of  bleeding, infection, damage to surrounding organs, that this would likely need to be a classical C section and that would require future pregnancies to be delivered via C section at 36-[redacted] weeks gestation and increases the risk for placenta previa and accreta  At this point given the above counseling patient has decided she would prefer a C section for distress. She is undecided about C section for labor and malpresentation but is leaning toward C section for malpresentation and labor. She will discuss with her husband and let the team know their decision.  - recommend close surveillance for development of signs/symptoms of chorioamnionitis or PPROM  - at this point of my consult around 13:00 the patient was painfully contracting at times and had contractions every 5 minutes therefore I recommend continue NPO and magnesium sulfate until stable - if contractions stop and patient remains stable can give regular diet and discontinue magnesium sulfate- would recommend restarting magnesium sulfate if delivery appears imminent and it has been at least 6 hours since magnesium was discontinued - recommend complete course of betamethasone - if magnesium is discontinued and patient begins contracting can consider a tocolytic; or can continue magnesium sulfate for tocolysis although its efficacy is limited - recommend continue ancef as GBS status is unknown and patient is contracting at this time with exposed membranes; may discontinue in several days if patient is stable - recommend inpatient management at this time - do not feel patient needs to be in trendelenburg but would continue bedrest - patient has a foley at this time as she is on magnesium sulfate; may discontinue foley when off of magnesium - recommend continuous fetal monitoring while on magnesium sulfate as patient would like C section for distress; if tracing is reassuring may  to TID monitoring when off of magnesium sulfate  Briefly discussed  future pregnancies: discussed difficult to determine if this is preterm labor or cervical insufficiency. Given patient had minimal symptoms to achieve cervical dilation I feel she may be a candidate for prophylactic cerclage in her next pregnancy. Discussed other options include 17OH progesterone which has been shown to reduce recurrence of preterm delivery by approximately 40% and option of cervical length surveillance. Recommend at the beginning of her next pregnancy a consult with Maternal Fetal Medicine to discuss options.  4. Pyelectasis: - not discussed today as patient was overwhelmed with conversation about preterm delivery - recommend continue to follow on ultrasound and if does not resolve will need postnatal follow up; inform Pediatricians at time of delivery 5. Fetus was measuring large for dates on ultrasound yesterday - patient has family history of diabetes - did not have early one hour - has hemoglobin A1C ordered for tomorrow - recommend check glucola in 1 week (as just received betamethasone) 6. Recommend SCDs as patient is at increased risk of venous thromboembolism with prolonged bedrest 7. Recommend continuous tocometer at this time 8. Recommend if patient is in labor to check fetal position as patient desires vaginal delivery if fetus is vertex; discussed may be small increase in adverse outcomes with vaginal delivery of extreme premature infants but risk is relatively similar to a C section and vaginal delivery would be preferred 9. If has labor or PPROM recommend sterile evaluation of cervix and if desires C section for malpresentation would recommend prior to fetal descent into the vagina  All of the patient's questions were answered. Discussed with Dr. Christell Constant. I spent 45 minutes in face to face consultation with the patient in addition to time spent on the ultrasound.  Eulis Foster, MD

## 2012-09-22 NOTE — Transfer of Care (Signed)
Immediate Anesthesia Transfer of Care Note  Patient: Wanda Mccann  Procedure(s) Performed: Procedure(s) (LRB) with comments: CESAREAN SECTION (N/A)  Patient Location: PACU  Anesthesia Type: Spinal  Level of Consciousness: awake, alert , oriented and patient cooperative  Airway & Oxygen Therapy: Patient Spontanous Breathing  Post-op Assessment: Report given to PACU RN and Post -op Vital signs reviewed and stable  Post vital signs: Reviewed and stable  Complications: No apparent anesthesia complications

## 2012-09-22 NOTE — Progress Notes (Signed)
Tracing maternal before case. Was told by anes that we had to get started. Dr. Christell Constant notifed.

## 2012-09-22 NOTE — Anesthesia Postprocedure Evaluation (Signed)
Anesthesia Post Note  Patient: Wanda Mccann  Procedure(s) Performed: Procedure(s) (LRB): CESAREAN SECTION (N/A)  Anesthesia type: Spinal  Patient location: PACU  Post pain: Pain level controlled  Post assessment: Post-op Vital signs reviewed  Last Vitals:  Filed Vitals:   09/22/12 2015  BP:   Pulse:   Temp: 36.9 C  Resp: 18    Post vital signs: Reviewed  Level of consciousness: awake  Complications: No apparent anesthesia complications

## 2012-09-22 NOTE — Progress Notes (Signed)
Fetal care center ultrasound  Indication: 21 yr old G1P0 at [redacted]w[redacted]d with preterm labor vs cervical insufficiency, advanced cervical dilation, and prolapsing membranes for ultrasound and consult.  Findings: 1. Single intrauterine pregnancy. 2. Right lateral placenta without evidence of previa. 3. Normal amniotic fluid volume. 4. The cervix appears 4.4cm dilated on abdominal ultrasound with membranes prolapsing into the vagina. 5. The fetus is in footling breech presentation. 6. There is bilateral pyelectasis; the left renal pelvis measures 4.5mm; the right measures 4.46mm. 7. The remainder of the limited anatomy survey is normal. Anatomy was limited by fetal position and maternal body habitus.  Recommendations: 1. Fetal growth was performed yesterday and fetus measured 862g. 2. Normal limited anatomy survey. 3. Preterm labor: - see consult note.  Eulis Foster, MD

## 2012-09-22 NOTE — Progress Notes (Signed)
Pt transferred to room 161 from room 158. MFM in to do bedside U/S.

## 2012-09-22 NOTE — Op Note (Signed)
Cesarean Section Procedure Note  Indications: malpresentation: double footling breech and bleeding and active labor and morbid obesity  Pre-operative Diagnosis: 23 week 6 day pregnancy.  Post-operative Diagnosis: same  Surgeon: Delbert Harness.   Assistants: CST x 2  Anesthesia: Spinal anesthesia  ASA Class: na   Procedure Details   The patient was seen in the Holding Room. The risks, benefits, complications, treatment options, and expected outcomes were discussed with the patient.  The patient concurred with the proposed plan, giving informed consent.  The site of surgery properly noted/marked. The patient was taken to Operating Room # 2, identified as Wanda Mccann and the procedure verified as C-Section Delivery. A Time Out was held and the above information confirmed.  After induction of anesthesia, spinal anesthesia was administered.  The fetal heart rate was obtained for approximately 20 seconds.The patient was draped and prepped in the usual sterile manner. A Pfannenstiel incision was made and carried down through the subcutaneous tissue to the fascia. Fascial incision was made and extended transversely. The fascia was separated from the underlying rectus tissue superiorly and inferiorly. The peritoneum was identified and entered bluntly.  There was minimal development of the lower uterine segment in spite of the fact that the baby was in the lower uterine segment. A classical that didn't go above the round ligament uterine incision was made. Delivered from breech double footling and the hand was able to be placed under the but this is elevated to the incision. presentation was a  ?? gram Female with Apgar scores of ? at one minute and ? at five minutes. After the umbilical cord was clamped and cut, the baby was passed off to the awaiting neonatologist.  Attention was then returned to the placenta with this was delivered with manual extraction and passed off for further evaluation.  At  that time cord pH and cord blood was obtained for evaluation. The placenta was removed intact and appeared normal for the level of prematurity.. The uterine outline, tubes and ovaries appeared normal. The uterine incision was closed with running locked sutures of 0 Vicryl.  In 2 layers the second was a baseball stitch.  The uterine serosa was then reapproximated where areas of bleeding were noted.  3 figure-of-eight sutures were applied to create good hemostasis.  Good hemostasis was obtained the uterus was returned the abdomen. Hemostasis was observed.  The previously placed laparoscopic sponges in the paracolic gutters were removed at this time.  Interceed was placed over the hysterotomy. There was some difficulty in obtaining appropriate anesthesia and the patient did eventually get ketamine after several doses of Versed and fentanyl and dilaudid. The fascia was then reapproximated with running sutures of 0 Vicryl.  The subcutaneous tissue was reapproximated with 20 plain gut in interrupted fashion.  In 2 layers the first reapproximating Scarpa's layer and the second reapproximating just underneath the dermis.  The skin was reapproximated with 4-0 Vicryl.  Instrument, sponge, and needle counts were correct prior the abdominal closure and at the conclusion of the case.   Findings: Although the lower uterine segment it was felt that this would not be big enough to deliver the baby through this.  A classical cesarean was performed.  Bleeding was fairly well-controlled throughout the procedure.  Please see delivery summary.  Normal ovaries and tubes bilaterally  Estimated Blood Loss:  750 mL         Drains: Foley to gravity with 150 mL  Total IV Fluids:  2800 ml         Specimens: Placenta          Implants: Interceed one single sheet         Complications:  None; patient tolerated the procedure well.         Disposition: PACU - hemodynamically stable.         Condition:  stable  Attending Attestation: I was present and scrubbed for the entire procedure.

## 2012-09-22 NOTE — Progress Notes (Signed)
UR Chart review completed.  

## 2012-09-22 NOTE — Anesthesia Procedure Notes (Addendum)
Spinal  Patient location during procedure: OR Start time: 09/22/2012 4:45 PM End time: 09/22/2012 4:56 PM Staffing Anesthesiologist: Sandrea Hughs Performed by: anesthesiologist  Preanesthetic Checklist Completed: patient identified, site marked, surgical consent, pre-op evaluation, timeout performed, IV checked, risks and benefits discussed and monitors and equipment checked Spinal Block Patient position: sitting Prep: DuraPrep Patient monitoring: heart rate, cardiac monitor, continuous pulse ox and blood pressure Approach: midline Location: L3-4 Injection technique: single-shot Needle Needle type: Sprotte  Needle gauge: 24 G Needle length: 15 cm Needle insertion depth: 11 cm Assessment Sensory level: T4 Additional Notes Needed 18G Tuohy X 3 to 9cm followed by Sprotte to +CSF.

## 2012-09-22 NOTE — Anesthesia Preprocedure Evaluation (Signed)
Anesthesia Evaluation  Patient identified by MRN, date of birth, ID band Patient awake    Reviewed: Allergy & Precautions, H&P , NPO status , Patient's Chart, lab work & pertinent test results  Airway Mallampati: II TM Distance: >3 FB Neck ROM: full    Dental No notable dental hx.    Pulmonary neg pulmonary ROS,  breath sounds clear to auscultation  Pulmonary exam normal       Cardiovascular negative cardio ROS      Neuro/Psych negative neurological ROS  negative psych ROS   GI/Hepatic negative GI ROS, Neg liver ROS,   Endo/Other  Morbid obesity  Renal/GU negative Renal ROS  negative genitourinary   Musculoskeletal negative musculoskeletal ROS (+)   Abdominal (+) + obese,   Peds negative pediatric ROS (+)  Hematology negative hematology ROS (+)   Anesthesia Other Findings   Reproductive/Obstetrics (+) Pregnancy                           Anesthesia Physical Anesthesia Plan  ASA: III and Emergent  Anesthesia Plan: Spinal   Post-op Pain Management:    Induction:   Airway Management Planned:   Additional Equipment:   Intra-op Plan:   Post-operative Plan:   Informed Consent: I have reviewed the patients History and Physical, chart, labs and discussed the procedure including the risks, benefits and alternatives for the proposed anesthesia with the patient or authorized representative who has indicated his/her understanding and acceptance.     Plan Discussed with: CRNA and Surgeon  Anesthesia Plan Comments:         Anesthesia Quick Evaluation  

## 2012-09-22 NOTE — Preoperative (Signed)
Beta Blockers   Reason not to administer Beta Blockers:Not Applicable 

## 2012-09-22 NOTE — Progress Notes (Signed)
Patient ID: Wanda Mccann, female   DOB: 1990-12-26, 21 y.o.   MRN: [id number] Hospital Day: 2  S: Preterm labor symptoms: states she had some feeling of increasing pain to the point that she is having contractions on the monitor approximately every 3-5 minutes.  On speculum exam the sole of the fluid is seen through the translucent membranes.  The perineum has bloody show.  A bedside ultrasound was performed and baby movement is present in both feet appear to be well below the Foley bulb in no apparent cervix..  The patient has decided to proceed with cesarean delivery secondary to active labor.  She states the contractions are more significant in their amount of pain.  Objectively the patient told me that she had to be stopped examining her so that she could get through the contraction.  Episode that was consistent with labor.  With continued breech double footling  the best route of delivery would be CSx and probably classical.   Patient was briefly counseled with regard to the risks benefits and alternatives of the procedure.  Proceed.  O: Temp:  [97.8 F (36.6 C)-98.9 F (37.2 C)] 98 F (36.7 C) (10/18 1548) Pulse Rate:  [90-115] 98  (10/18 1550) Resp:  [18-22] 20  (10/18 1545) BP: (117-145)/(44-78) 123/66 mmHg (10/18 1550) Weight:  [147.419 kg (325 lb)-147.51 kg (325 lb 3.2 oz)] 147.419 kg (325 lb) (10/17 2015)   WUJ:WJXBJYNW: 150s  bpm Toco: the patient reports increased cramping and contractions SVE:  speculum exam revealed prolapsed membranes to about midway to the vagina with the sole of the foot seen through the translucent membrane.  There is no grossly active bleeding.  However there was blood in the vagina and perineum was coated.  Scheduled Meds:   . betamethasone acetate-betamethasone sodium phosphate  12 mg Intramuscular Once  . betamethasone acetate-betamethasone sodium phosphate  12 mg Intramuscular Q24H  .  ceFAZolin (ANCEF) IV  1 g Intravenous Q8H  .  ceFAZolin  (ANCEF) IV  2 g Intravenous Once  . citric acid-sodium citrate      . docusate sodium  100 mg Oral Daily  . magnesium  6 g Intravenous Once  . prenatal multivitamin  1 tablet Oral Daily  . DISCONTD:  ceFAZolin (ANCEF) IV  2 g Intravenous Once   Continuous Infusions:   . lactated ringers 100 mL/hr at 09/22/12 1624  . magnesium sulfate 40 grams in LR 500 mL Stopped (09/22/12 1623)   PRN Meds:.acetaminophen, calcium carbonate, lidocaine, zolpidem   CBC    Component Value Date/Time   WBC 15.5* 09/21/2012 1935   RBC 4.24 09/21/2012 1935   HGB 12.5 09/21/2012 1935   HCT 36.8 09/21/2012 1935   PLT 233 09/21/2012 1935   MCV 86.8 09/21/2012 1935   MCH 29.5 09/21/2012 1935   MCHC 34.0 09/21/2012 1935   RDW 13.4 09/21/2012 1935    US revealed no appreciable cervical length and 1#8 and footling breech and hourglass membranes and now bedside ultrasound revealed that the 2 feet were below the Foley bulb and apparently through the cervix as seen per speculum exam.     GBS pending amnisure negative Foley in place  Trendelenberg Gen: appears to be appropriate and in no distress ABD: uterus not tender and nonacute Perineum with mucous but not grossly ruptured Ext NT no edema A/P- 21 y.o. admitted with: IUP now 23w6 by [place] [facility] 01-13-13 ( done at 11 weeks) Prolapsed membranes  FHR reassuring at this time  Patient Active Hospital Problem List: No active hospital problems.   Pregnancy Complications: prolapsed membranes in the vagina - cervical insufficiency  Preterm labor management: magnesium and bedrest Dating:  [redacted]w[redacted]d breech double footling active labor     Proceed with her cesarean delivery through Pfannenstiel incision through classical hysterotomy- MFM states cerclage late in pregnancy not viable option at this point

## 2012-09-23 LAB — CBC WITH DIFFERENTIAL/PLATELET
Basophils Relative: 0 % (ref 0–1)
HCT: 32.1 % — ABNORMAL LOW (ref 36.0–46.0)
Hemoglobin: 11 g/dL — ABNORMAL LOW (ref 12.0–15.0)
Lymphocytes Relative: 12 % (ref 12–46)
Lymphs Abs: 1.6 10*3/uL (ref 0.7–4.0)
Monocytes Relative: 8 % (ref 3–12)
Neutro Abs: 10.9 10*3/uL — ABNORMAL HIGH (ref 1.7–7.7)
Neutrophils Relative %: 80 % — ABNORMAL HIGH (ref 43–77)
RBC: 3.67 MIL/uL — ABNORMAL LOW (ref 3.87–5.11)
WBC: 13.6 10*3/uL — ABNORMAL HIGH (ref 4.0–10.5)

## 2012-09-23 LAB — URINE CULTURE
Colony Count: NO GROWTH
Culture: NO GROWTH
Special Requests: NORMAL

## 2012-09-23 LAB — HEMOGLOBIN A1C
Hgb A1c MFr Bld: 5.2 % (ref <5.7)
Mean Plasma Glucose: 103 mg/dL (ref <117)

## 2012-09-23 NOTE — Progress Notes (Signed)
Notified by RN that pt's IV infiltrated and she would need it for scheduled Ancef.  After review of notes, Ancef was ordered prior to delivery when advanced dilitation  noted and membranes were in the vagina.  Pt's c-section was uncomplicated.  She is currently afebrile, WBC 13.  Will discontinue Ancef and notify RN.    Also note that pt is Rh negative, baby is O neg.  Rhogam not indicated.

## 2012-09-23 NOTE — Progress Notes (Signed)
Subjective: Postpartum Day 1: Cesarean Delivery Patient reports that pain is well controlled.  Ambulating without difficulty.  Tolerating po.  Started using breast pump last night, no colostrum noted.  Emotionally doing better than she thought. Per RN, pt's dressing got wet when she showered.  She removed it and noted that the steri strips were wet and bloody.  She never saw any active bleeding.  Placed abdominal pads over incisions to dry.  Objective: Vital signs in last 24 hours: Temp:  [98 F (36.7 C)-98.6 F (37 C)] 98.5 F (36.9 C) (10/19 1000) Pulse Rate:  [79-110] 79  (10/19 1000) Resp:  [16-20] 18  (10/19 1000) BP: (99-128)/(34-74) 127/56 mmHg (10/19 1000) SpO2:  [98 %-100 %] 100 % (10/19 1000)  Physical Exam:  General: alert, cooperative and no distress Lochia: Not assessed. Uterine Fundus: firm Incision: Steri strips with gross blood.  No active bleeding visualized.   DVT Evaluation: SCDs in place.   Basename 09/23/12 0525 09/21/12 1935  HGB 11.0* 12.5  HCT 32.1* 36.8    Assessment/Plan: Status post Cesarean section. Doing well postoperatively.  Continue current care. Remove steri strips and replace.  Keep incision dry under pannus with abd pads.  Wanda Mccann 09/23/2012, 12:01 PM

## 2012-09-23 NOTE — Progress Notes (Signed)
BREAST PUMPING INITIATED.   EDUCATED PT AND FAMILY ON THE PUMPING PROCESS. NO QUESTIONS AT THIS TIME. WILL CONTINUE TO MONITOR.

## 2012-09-24 LAB — CULTURE, BETA STREP (GROUP B ONLY)

## 2012-09-24 NOTE — Clinical Social Work Note (Signed)
Clinical Social Work Department PSYCHOSOCIAL ASSESSMENT - MATERNAL/CHILD 09/24/2012  Patient:  Wanda Mccann, Wanda Mccann  Account Number:  [id number]  Admit Date:  09/21/2012  Marjo Bicker Name:   Fenton Malling    Clinical Social Worker:  Truman Hayward, LCSW   Date/Time:  09/24/2012 01:00 PM  Date Referred:  09/24/2012   Referral source  Physician  RN     Referred reason  NICU   Other referral source:    I:  FAMILY / HOME ENVIRONMENT Child's legal guardian:  PARENT  Guardian - Name Guardian - Age Guardian - Address  Lamonda Noxon [address] [place], [place] [address]  Paralee Cancel  [address] [place], [place] [address]   Other household support members/support persons Name Relationship DOB  no one else in home     Other support:   MOB and [name] report good family support and MOB has lots of family in there area.    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, nutritional resources:  [employer] If Medicaid - County:  [employer] Clinical biochemist  WIC   School / Grade:   Maternity Care Coordinator / Child Services Coordination / Early Interventions:  Cultural issues impacting care:    III  STRENGTHS Strengths  Home prepared for Child (including basic supplies)  Adequate Resources  Compliance with medical plan  Understanding of illness  Supportive family/friends   Strength comment:    IV  RISK FACTORS AND CURRENT PROBLEMS Current Problem:  None   Risk Factor & Current Problem Patient Issue Family Issue Risk Factor / Current Problem Comment   N N     V  SOCIAL WORK ASSESSMENT CSW spoke with MOB and [name] in room.  CSW discussed SW role in the NICU and discussed infant admission.  Both expressed understanding of admission and good communication with nurses and doctors.  CSW asked about emotional state and both MOB and [name] expressed they  are managing and agreed to let CSW know if any concerns arise.   This is their first baby and they both live together in [place].  CSW discussed possibility of resources for transportation to visit infant.  CSW discussed any concerns with medicaid. MOB reports they will only have food stamps through December due to [name] recently obtaining a job.  CSW discussed supplies and both expressed no concerns at this time.  CSW discussed family support and MOB and [name] both expressed good family support and that they have been to the hospital visiting all day.  Both MOB and [name] are employed currently.  No hx of drug use and no current concerns.  CSW discussed SSI and completed worksheet with MOB and [name] and what this qualifies them for.  CSW will continue to follow infant while in NICU.      VI SOCIAL WORK PLAN Social Work Plan  Psychosocial Support/Ongoing Assessment of Needs   Type of pt/family education:   If child protective services report - county:   If child protective services report - date:   Information/referral to community resources comment:   Other social work plan:

## 2012-09-24 NOTE — Progress Notes (Signed)
Subjective: Postpartum Day 2: Cesarean Delivery Patient reports tolerating PO.  Pain is controlled.  Ambulating well.  Bleeding is normal.  Pt was able to express some colostrum today.  Baby is stable in NICU.  Objective: Vital signs in last 24 hours: Temp:  [97.4 F (36.3 C)-98.9 F (37.2 C)] 98.9 F (37.2 C) (10/20 1800) Pulse Rate:  [70-89] 74  (10/20 1800) Resp:  [18-22] 18  (10/20 1800) BP: (124-137)/(70-72) 137/72 mmHg (10/20 1800) SpO2:  [97 %-100 %] 100 % (10/20 1800)  Physical Exam:  General: alert and no distress.  Family at bedside and pt appears to be emotionally well. Lochia: appropriate Uterine Fundus: firm Incision: Clean steri strips.  No drainage.  Incision dry. DVT Evaluation: No evidence of DVT seen on physical exam. Calf/Ankle edema is present.   Basename 09/23/12 0525  HGB 11.0*  HCT 32.1*    Assessment/Plan: Status post Cesarean section. Doing well postoperatively.  Continue current care. Continue pumping. Encouraged ambulation.  Geryl Rankins 09/24/2012, 9:10 PM

## 2012-09-25 ENCOUNTER — Encounter (HOSPITAL_COMMUNITY): Payer: Self-pay | Admitting: Obstetrics & Gynecology

## 2012-09-25 NOTE — Progress Notes (Signed)
Patient ID: Wanda Mccann, female   DOB: [birth year], 21 y.o.   MRN: [id number] Subjective: Postpartum Day3: Cesarean Delivery Patient reports incisional pain, + flatus and no problems voiding.    Objective: Vital signs in last 24 hours: Temp:  [97.9 F (36.6 C)-98.9 F (37.2 C)] 97.9 F (36.6 C) (10/21 0600) Pulse Rate:  [70-78] 75  (10/21 0600) Resp:  [18-22] 20  (10/21 0600) BP: (128-139)/(63-72) 128/63 mmHg (10/21 0600) SpO2:  [99 %-100 %] 99 % (10/20 2200)  Physical Exam:  General: alert, cooperative and no distress Lochia: appropriate Uterine Fundus: firm and appropriately tender Incision: healing well DVT Evaluation: No evidence of DVT seen on physical exam. Negative Homan's sign. No cords or calf tenderness. No significant calf/ankle edema.   Basename 09/23/12 0525  HGB 11.0*  HCT 32.1*    Assessment/Plan: Status post Cesarean section. Doing well postoperatively.  wii DC tomorrow as she states the pain is too much to go home with today and it was a classical csx.  Zarai Orsborn H. 09/25/2012, 9:44 AM

## 2012-09-25 NOTE — Progress Notes (Signed)
Ur chart review completed.  

## 2012-09-25 NOTE — Progress Notes (Signed)
I visited with Wanda Mccann and [name] at nurse's referral.  They are coping well with the difficult situation of having a baby in the NICU for an extended period of time.  They reported that their son, Wanda Mccann, is doing well and that he gives them hope.  They are aware that babies in the NICU are often up and down and they are prepared for that and are taking each moment as it comes.  They have good family support and are also working on how to update family and friends in a way that gives them some space.  They have already talked to a Child psychotherapist about their concerns with living 45 minutes away.   I provided compassionate listening as they told their birth story and provided affirmation as they shared how they are making sense of this difficult situation.   We will continue to follow-up with them when we see them in the NICU, but please also page as needs arise, [phone].  Chaplain Dyanne Carrel 10:58 AM   09/25/12 1000  Clinical Encounter Type  Visited With Patient and family together  Visit Type Spiritual support  Referral From Nurse  Spiritual Encounters  Spiritual Needs Emotional  Stress Factors  Patient Stress Factors (premature baby in NICU)

## 2012-09-26 MED ORDER — OXYCODONE-ACETAMINOPHEN 5-325 MG PO TABS: 1.0000 | ORAL_TABLET | ORAL | Status: DC | PRN

## 2012-09-26 NOTE — Discharge Summary (Signed)
Obstetric Discharge Summary Reason for Admission: prolapsed membranes, [redacted]w[redacted]d,  Prenatal Procedures: ultrasound Intrapartum Procedures: cesarean: classical Postpartum Procedures: none Complications-Operative and Postpartum: none Hemoglobin  Date Value Range Status  09/23/2012 11.0* 12.0 - 15.0 g/dL Final     HCT  Date Value Range Status  09/23/2012 32.1* 36.0 - 46.0 % Final    Physical Exam:  General: alert, cooperative and no distress Lochia: appropriate Uterine Fundus: firm Incision: healing well DVT Evaluation: No evidence of DVT seen on physical exam. Negative Homan's sign. No cords or calf tenderness. No significant calf/ankle edema.  Discharge Diagnoses: Incompetent cervix and Prolapsed membranes, [redacted]w[redacted]d, preterm delivery at [redacted]w[redacted]d, S/p classical CSx, morbid obesity, malpresentation with prolapsed foot in the membranes in the vagina  Discharge Information: Date: 09/26/2012 Activity: pelvic rest and no heavy lifting Diet: routine Medications: PNV, Ibuprofen and Percocet Condition: stable Instructions: refer to practice specific booklet Discharge to: home   Newborn Data: Live born female  Birth Weight: 1 lb 10.5 oz (750 g) APGAR: 6, 7  Home with In NICU for a prolonged hospital stay.  Yaniah Thiemann H. 09/26/2012, 9:16 AM

## 2012-09-26 NOTE — Progress Notes (Signed)
Pt discharged to home with significant other.  Condition stable.  Pt ambulated to car with Daphane Shepherd, RN.  No equipment ordered for home at discharge.  Pt to get breast pump from [facility] office this afternoon.

## 2012-09-26 NOTE — Progress Notes (Signed)
Patient ID: Wanda Mccann, female   DOB: 02-09-1991, 21 y.o.   MRN: [id number] See DC summary

## 2012-09-27 ENCOUNTER — Encounter (HOSPITAL_COMMUNITY): Payer: Self-pay

## 2012-10-03 ENCOUNTER — Encounter: Payer: Self-pay | Admitting: Obstetrics & Gynecology

## 2012-10-04 NOTE — MAU Provider Note (Signed)
Agree and saw pt as well in MAU and did not repeat exam as US done confirming the problem - please see H&P

## 2012-11-21 ENCOUNTER — Ambulatory Visit (HOSPITAL_COMMUNITY)
Admission: RE | Admit: 2012-11-21 | Discharge: 2012-11-21 | Disposition: A | Discharge status: Home / Self Care | Payer: Medicaid Other | Source: Ambulatory Visit | Attending: Obstetrics & Gynecology | Admitting: Obstetrics & Gynecology

## 2012-11-21 ENCOUNTER — Encounter (HOSPITAL_COMMUNITY): Payer: Self-pay

## 2012-11-21 DIAGNOSIS — Z98891 History of uterine scar from previous surgery: Secondary | ICD-10-CM

## 2012-11-21 DIAGNOSIS — Z3169 Encounter for other general counseling and advice on procreation: Secondary | ICD-10-CM | POA: Insufficient documentation

## 2012-11-21 DIAGNOSIS — Z8751 Personal history of pre-term labor: Secondary | ICD-10-CM | POA: Insufficient documentation

## 2012-11-21 NOTE — Consult Note (Signed)
Wanda Mccann is a 21 year old G1P0100 who is in today for a preconception visit.   Due to her history of prior preterm delivery, it is recommended that she begin therapy with progesterone for prevention of recurrent preterm birth. 17-hydroxy progesterone caproate 250mg  should be given as weekly i.m. injections from 16-[redacted] weeks gestation. Cervical insufficiency was discussed with patient.  It is unclear on based on her history if she had preterm labor vs. Cervical insufficiency.  At this time with a single loss current recommendations would favor transvaginal cervical length every 2 weeks from 16-28 wks with cerclage placement for ultrasound indications should they arise prior to 24 weeks.   Due to prior classical C section  future pregnancies will need to be delivered via C section at 36-[redacted] weeks gestation and she has an increased risk for abnormal placentation such as placenta previa and accreta.  Wanda Mccann is counseled also that <18 months between pregnancies is considered a short interconception period.  Within this window should she get pregnant she has an increased risk of preterm labor and potential uterine rupture.  This patient was discussed with Dr. Sherrie George.   Thank you for allowing [place] to participate in the care of this patient.

## 2012-11-21 NOTE — Progress Notes (Addendum)
MATERNAL FETAL MEDICINE CONSULT  Patient Name: Wanda Mccann Medical Record Number:  [id number] Date of Birth: 27-Nov-1991 Requesting Physician Name:  Delbert Harness, MD Date of Service: 11/21/2012  Chief Complaint Preconception visit- Recent preterm delivery with neonatal demise at 47 6/7  History of Present Illness Wanda Mccann was seen today for prenatal diagnosis secondary to recent preterm delivery with neonatal demise at 28 6/7 at the request of Filbert Berthold MD.  The patient is a 21 y.o. G1P0100 who is not currently pregnant.  Of note she had a classical cesarean section on 09/22/12 this year after having advanced cervical dilation and prolapsing membranes with a breech fetus.  She reports some cramping the day before after a fall but denies any bleeding or other symptoms with this delivery.  Review of Systems A comprehensive review of systems was negative.  Patient History OB History    Grav Para Term Preterm Abortions TAB SAB Ect Mult Living   1 1  1      1      # Outc Date GA Lbr Len/2nd Wgt Sex Del Anes PTL Lv   1 PRE 10/13 [redacted]w[redacted]d 00:00  M CCS Spinal  Yes      Past Medical History  Diagnosis Date  . No pertinent past medical history     Past Surgical History  Procedure Date  . No past surgeries   . Cesarean section 09/22/2012    Procedure: CESAREAN SECTION;  Surgeon: Delbert Harness, MD;  Location: [facility];  Service: Obstetrics;  Laterality: N/A;    History   Social History  . Marital Status: Married    Spouse Name: N/A    Number of Children: N/A  . Years of Education: N/A   Social History Main Topics  . Smoking status: Former Games developer  . Smokeless tobacco: None  . Alcohol Use: No  . Drug Use: No  . Sexually Active: Yes   Other Topics Concern  . None   Social History Narrative  . None    No family history on file. In addition, the patient has no family history of mental retardation, birth defects, or genetic diseases.  Physical  Examination There were no vitals filed for this visit. General appearance - alert, well appearing, and in no distress  Assessment and Recommendations Wanda Mccann is a 21 year old G1P0100 who is in today for a preconception visit.   Due to her history of prior preterm delivery, it is recommended that she begin therapy with progesterone for prevention of recurrent preterm birth. 17-hydroxy progesterone caproate 250mg  should be given as weekly i.m. injections from 16-[redacted] weeks gestation. Cervical insufficiency was discussed with patient.  It is unclear on based on her history if she had preterm labor vs. Cervical insufficiency.  At this time with a single loss current recommendations would favor transvaginal cervical length every 2 weeks from 16-28 wks with cerclage placement for ultrasound indications should they arise prior to 24 weeks.   Due to prior classical C section  future pregnancies will need to be delivered via C section at 36-[redacted] weeks gestation and she has an increased risk for abnormal placentation such as placenta previa and accreta.  Wanda Mccann is counseled also that <18 months between pregnancies is considered a short interconception period.  Within this window should she get pregnant she has an increased risk of preterm labor and potential uterine rupture.  This patient was discussed with Dr. Sherrie George.   Thank you for allowing [place]  to participate in the care of this patient.   Molly Maduro, MD MFM Fellow  45 minutes was spent with the patient >50% of which was face to face counseling.

## 2012-11-23 NOTE — Addendum Note (Signed)
Encounter addended by: Ty Hilts, RN on: 11/23/2012  8:24 AM<BR>     Documentation filed: Charges VN

## 2013-03-09 ENCOUNTER — Encounter (HOSPITAL_COMMUNITY): Payer: Self-pay | Admitting: Family

## 2013-03-09 ENCOUNTER — Inpatient Hospital Stay (HOSPITAL_COMMUNITY)
Admission: AD | Admit: 2013-03-09 | Discharge: 2013-03-09 | Disposition: A | Discharge status: Home / Self Care | Payer: Medicaid Other | Source: Ambulatory Visit | Attending: Obstetrics & Gynecology | Admitting: Obstetrics & Gynecology

## 2013-03-09 DIAGNOSIS — O26899 Other specified pregnancy related conditions, unspecified trimester: Secondary | ICD-10-CM

## 2013-03-09 DIAGNOSIS — J069 Acute upper respiratory infection, unspecified: Secondary | ICD-10-CM | POA: Insufficient documentation

## 2013-03-09 DIAGNOSIS — J3489 Other specified disorders of nose and nasal sinuses: Secondary | ICD-10-CM | POA: Insufficient documentation

## 2013-03-09 DIAGNOSIS — R059 Cough, unspecified: Secondary | ICD-10-CM | POA: Insufficient documentation

## 2013-03-09 DIAGNOSIS — R109 Unspecified abdominal pain: Secondary | ICD-10-CM | POA: Insufficient documentation

## 2013-03-09 DIAGNOSIS — R05 Cough: Secondary | ICD-10-CM | POA: Insufficient documentation

## 2013-03-09 DIAGNOSIS — O99891 Other specified diseases and conditions complicating pregnancy: Secondary | ICD-10-CM | POA: Insufficient documentation

## 2013-03-09 DIAGNOSIS — R509 Fever, unspecified: Secondary | ICD-10-CM | POA: Insufficient documentation

## 2013-03-09 LAB — URINALYSIS, ROUTINE W REFLEX MICROSCOPIC
Glucose, UA: NEGATIVE mg/dL
Leukocytes, UA: NEGATIVE
Protein, ur: NEGATIVE mg/dL
pH: 6 (ref 5.0–8.0)

## 2013-03-09 LAB — WET PREP, GENITAL: Clue Cells Wet Prep HPF POC: NONE SEEN

## 2013-03-09 LAB — URINE MICROSCOPIC-ADD ON

## 2013-03-09 LAB — POCT PREGNANCY, URINE: Preg Test, Ur: POSITIVE — AB

## 2013-03-09 NOTE — MAU Note (Addendum)
Patient presents to MAU with c/o cold s/s for 4-5 days (productive cough with small amount of brown sputum, nasal congestion, fever); reports she began having lower abdominal cramps radiating to lower back this morning. Reports she has been taking Tylenol and Benadryl for s/s, although denies taking anything today.  Denies vaginal bleeding, LOF. Patient reports hx of [redacted]w[redacted]d delivery; child lived for 11 days.

## 2013-03-09 NOTE — MAU Note (Signed)
Patient states she has had cold symptoms for about 3 days. OTC medication not working. Has a fever mostly at night up to 101. Has abdominal cramping since this am, no bleeding.

## 2013-03-09 NOTE — MAU Provider Note (Signed)
History     CSN: [id number]  Arrival date and time: 03/09/13 1429   First Provider Initiated Contact with Patient 03/09/13 1540      Chief Complaint  Patient presents with  . Abdominal Pain  . URI   HPI Wanda Mccann is 22 y.o. G2P0101 [redacted]w[redacted]d weeks presenting with URI sxs X 4-5days.  Coughing earlier this week, less now.  Feels like she is cramping.  She is concerned because her previous pregnancy was delivered at [redacted]w[redacted]d, that lived 11 days.   Denies vaginal bleeding.      Past Medical History  Diagnosis Date  . No pertinent past medical history     Past Surgical History  Procedure Laterality Date  . No past surgeries    . Cesarean section  09/22/2012    Procedure: CESAREAN SECTION;  Surgeon: Delbert Harness, MD;  Location: [facility];  Service: Obstetrics;  Laterality: N/A;    History reviewed. No pertinent family history.  History  Substance Use Topics  . Smoking status: Former Games developer  . Smokeless tobacco: Not on file  . Alcohol Use: No    Allergies:  Allergies  Allergen Reactions  . Penicillins Rash    Prescriptions prior to admission  Medication Sig Dispense Refill  . acetaminophen (TYLENOL) 500 MG tablet Take 500 mg by mouth every 6 (six) hours as needed. Takes for pain      . diphenhydrAMINE (BENADRYL) 25 MG tablet Take 25 mg by mouth every 8 (eight) hours as needed for allergies.      . Prenatal Vit-Fe Fumarate-FA (PRENATAL MULTIVITAMIN) TABS Take 1 tablet by mouth every morning.        Review of Systems  Constitutional: Negative for fever and chills.  HENT: Positive for congestion (nasal).   Respiratory: Positive for cough (resolving).   Gastrointestinal: Positive for abdominal pain (cramping).  Genitourinary:       Neg for vaginal bleeding   Physical Exam   Blood pressure 136/86, pulse 87, temperature 99.1 F (37.3 C), temperature source Oral, resp. rate 20, height 5\' 8"  (1.727 m), weight 319 lb 12.8 oz (145.06 kg), last menstrual period  12/23/2012, SpO2 100.00%.  Physical Exam  Nursing note and vitals reviewed. Constitutional: She is oriented to person, place, and time. She appears well-developed and well-nourished. No distress.  HENT:  Head: Normocephalic.  Neck: Normal range of motion.  Cardiovascular: Normal rate.   Respiratory: Effort normal.  GI: Soft. She exhibits no distension and no mass. There is no tenderness. There is no rebound and no guarding.  Genitourinary: There is no rash, tenderness or lesion on the right labia. There is no rash, tenderness or lesion on the left labia. Uterus is enlarged (10-11 week size). Uterus is not tender. Cervix exhibits no discharge and no friability. Right adnexum displays no mass, no tenderness and no fullness. Left adnexum displays no mass, no tenderness and no fullness. No tenderness or bleeding around the vagina. No vaginal discharge found.  Neurological: She is alert and oriented to person, place, and time.  Skin: Skin is warm and dry.  Psychiatric: She has a normal mood and affect. Her behavior is normal.   + Fetal heart tones by doppler-180  Results for orders placed during the hospital encounter of 03/09/13 (from the past 24 hour(s))  URINALYSIS, ROUTINE W REFLEX MICROSCOPIC     Status: Abnormal   Collection Time    03/09/13  3:05 PM      Result Value Range   Color, Urine  YELLOW  YELLOW   APPearance CLEAR  CLEAR   Specific Gravity, Urine 1.020  1.005 - 1.030   pH 6.0  5.0 - 8.0   Glucose, UA NEGATIVE  NEGATIVE mg/dL   Hgb urine dipstick TRACE (*) NEGATIVE   Bilirubin Urine NEGATIVE  NEGATIVE   Ketones, ur NEGATIVE  NEGATIVE mg/dL   Protein, ur NEGATIVE  NEGATIVE mg/dL   Urobilinogen, UA 0.2  0.0 - 1.0 mg/dL   Nitrite NEGATIVE  NEGATIVE   Leukocytes, UA NEGATIVE  NEGATIVE  URINE MICROSCOPIC-ADD ON     Status: Abnormal   Collection Time    03/09/13  3:05 PM      Result Value Range   Squamous Epithelial / LPF FEW (*) RARE   WBC, UA 0-2  <3 WBC/hpf   RBC / HPF  3-6  <3 RBC/hpf   Bacteria, UA RARE  RARE  POCT PREGNANCY, URINE     Status: Abnormal   Collection Time    03/09/13  3:16 PM      Result Value Range   Preg Test, Ur POSITIVE (*) NEGATIVE  WET PREP, GENITAL     Status: Abnormal   Collection Time    03/09/13  4:09 PM      Result Value Range   Yeast Wet Prep HPF POC NONE SEEN  NONE SEEN   Trich, Wet Prep NONE SEEN  NONE SEEN   Clue Cells Wet Prep HPF POC NONE SEEN  NONE SEEN   WBC, Wet Prep HPF POC FEW (*) NONE SEEN   MAU Course  Procedures  MDM Reported MSE to Dr. Christell Constant.  Order given for the NP to evaluate and treat his patient.    Assessment and Plan  A:  Upper respiratory infection      Cramping in first trimester  P:  OTC sudafed, robitussin as needed for cold sxs      Increase po fluids      Call Dr. Christell Constant Monday to make appointment  KEY,EVE M 03/09/2013, 4:56 PM   Agreed with above plan of care

## 2013-03-10 LAB — GC/CHLAMYDIA PROBE AMP: GC Probe RNA: NEGATIVE

## 2013-06-11 IMAGING — US US ABDOMEN LIMITED
2 series · 14 of 25 positions shown · non-contrast
Comparison: None.

CLINICAL DATA: Right upper quadrant pain.

LIMITED ABDOMINAL ULTRASOUND - RIGHT UPPER QUADRANT

[Series 1: us abdomen limited · 10 of 34 slices shown (1 of 2)]
[im 1/34]
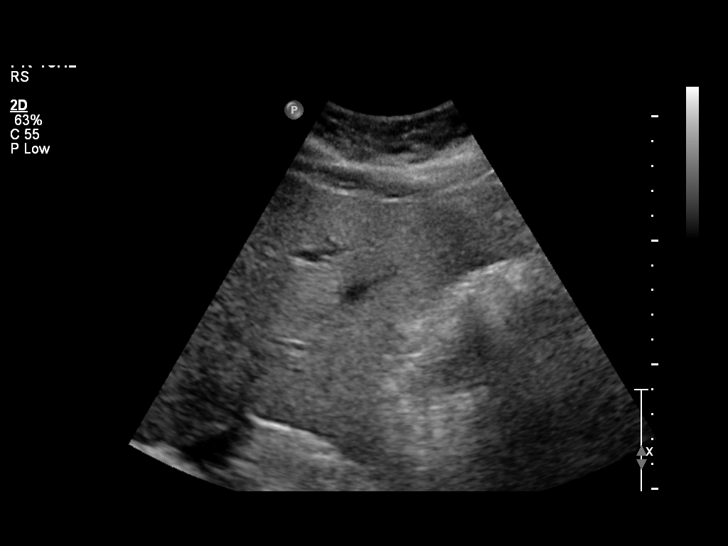
[im 5/34]
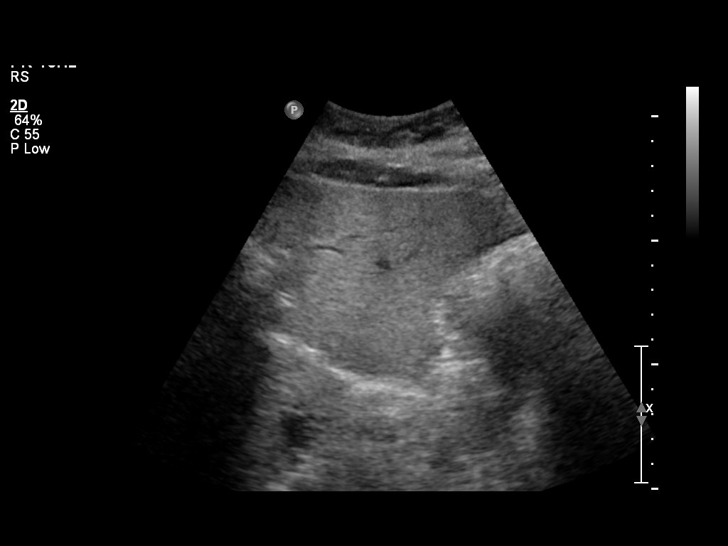
[im 9/34]
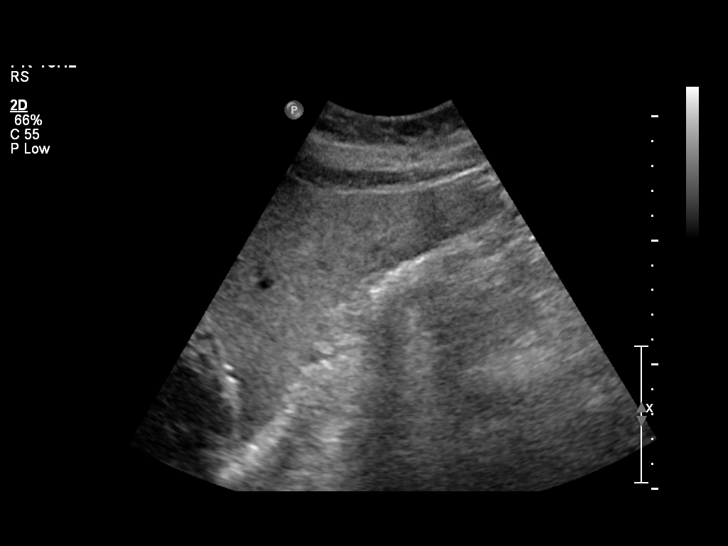
[im 13/34]
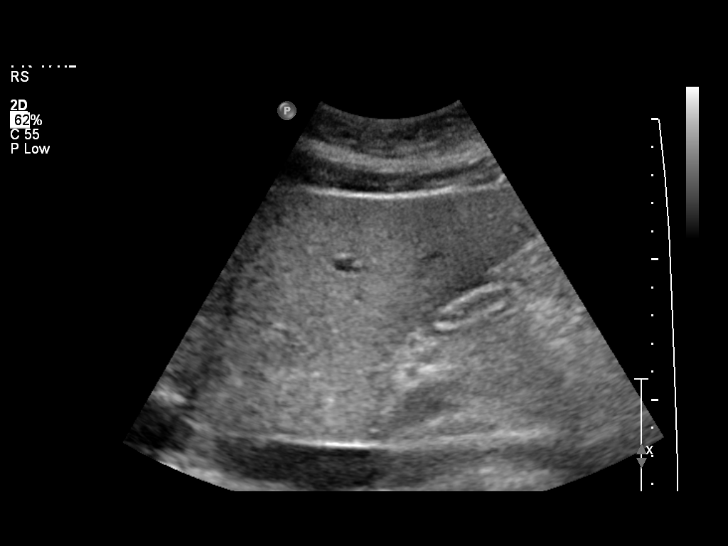
[im 17/34]
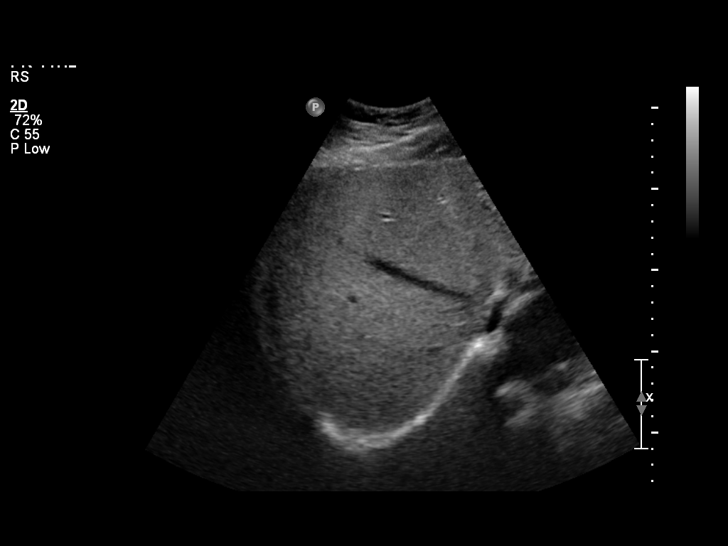
[im 19/34]
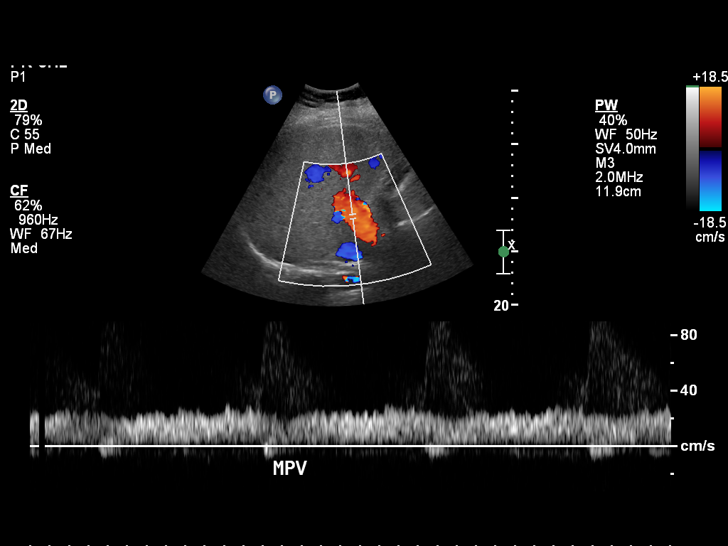
[im 23/34]
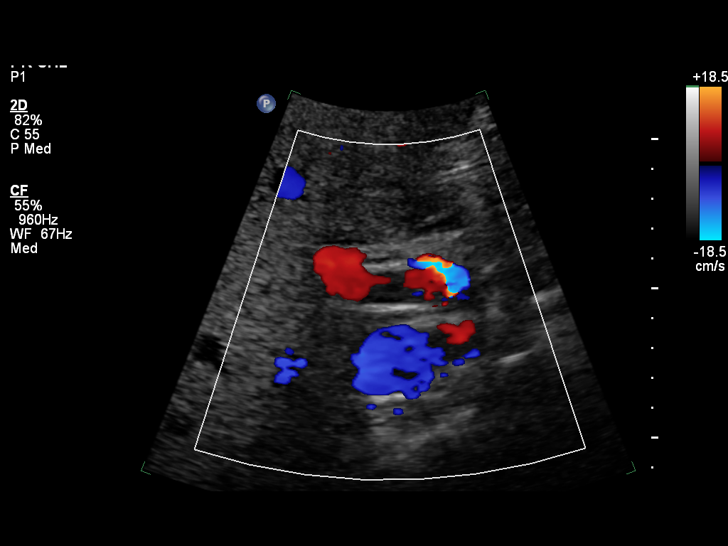
[im 27/34]
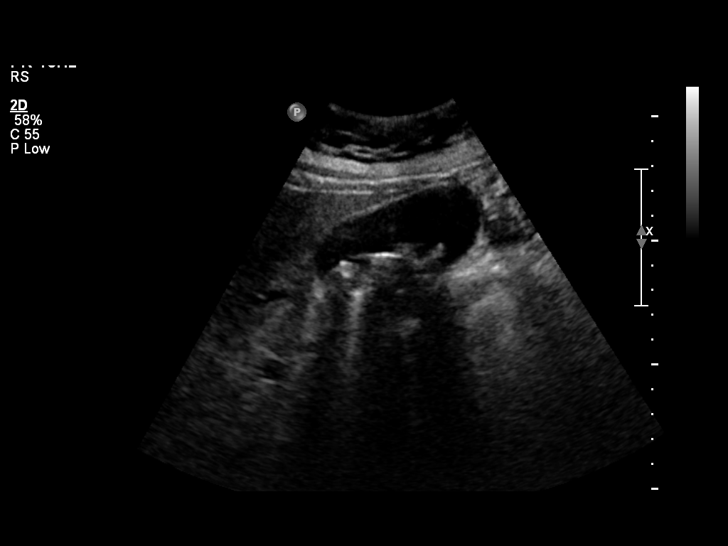
[im 31/34]
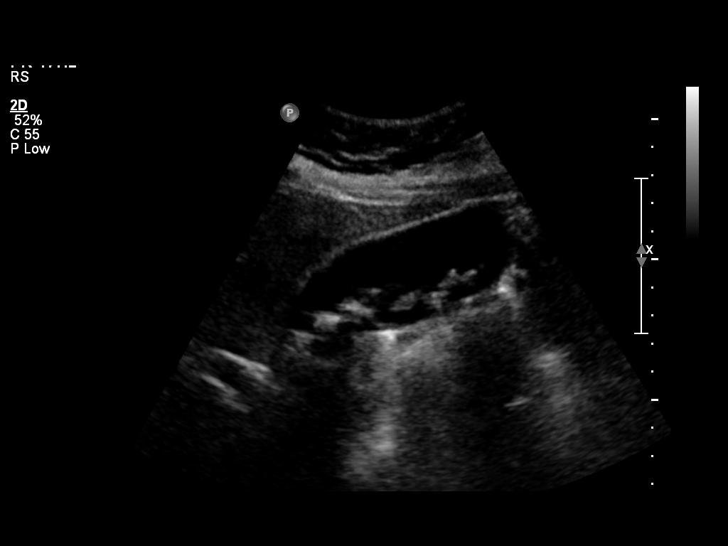
[im 34/34]
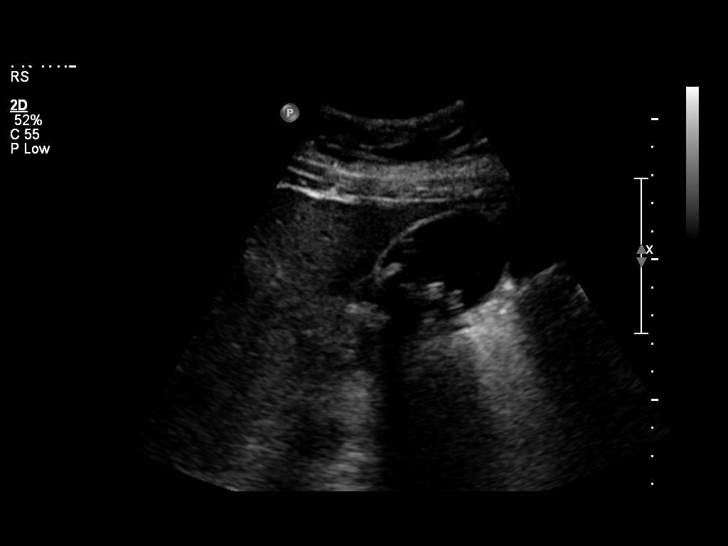

[Series 1: us abdomen limited · 4 of 15 slices shown (2 of 2)]
[im 3/15]
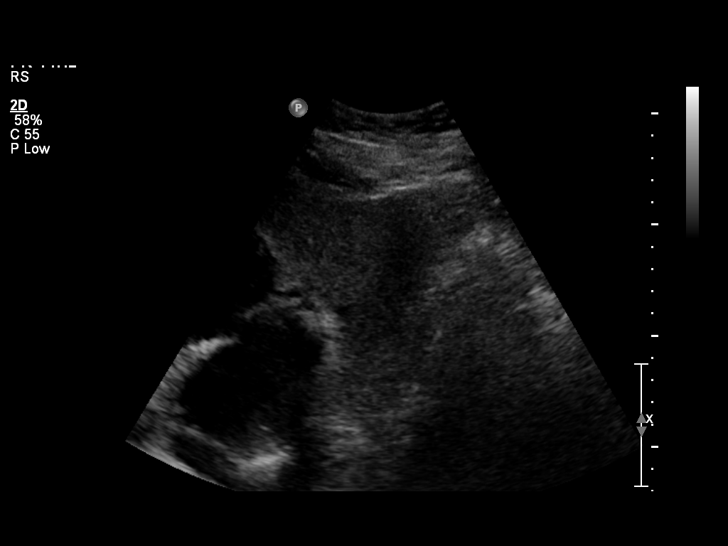
[im 7/15]
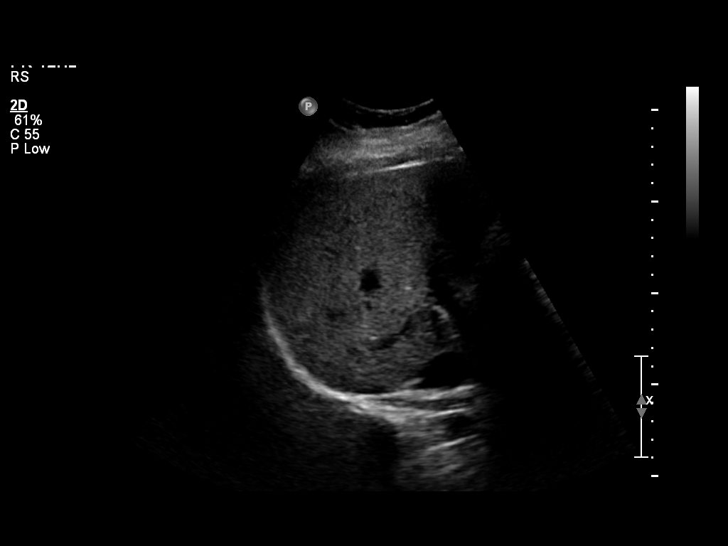
[im 11/15]
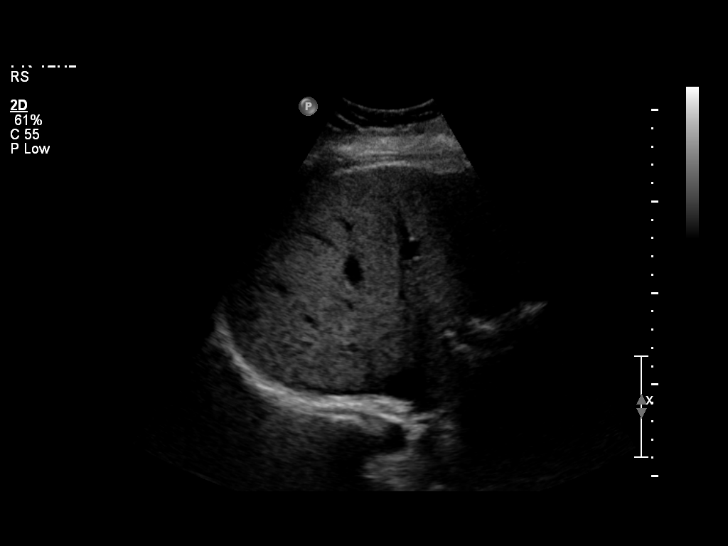
[im 15/15]
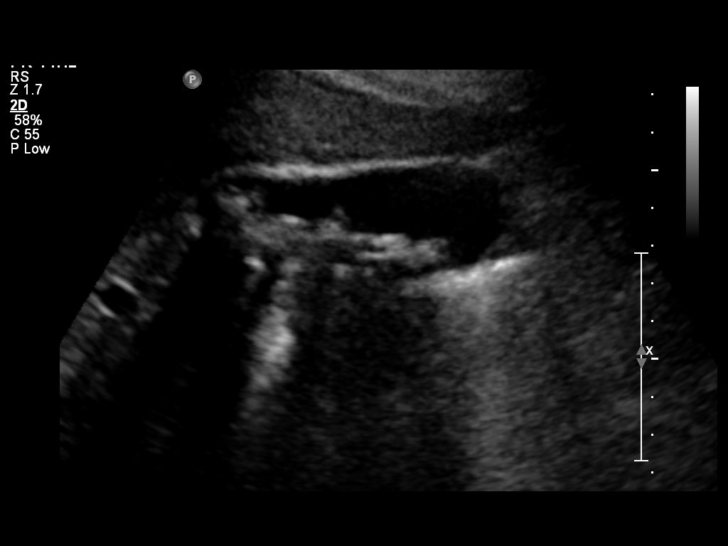

[14 of 25 positions shown; findings below may reference images not displayed]

FINDINGS: Gallbladder:  Multiple gallstones are seen, with largest measuring
approximately 2 cm in diameter.  No evidence of gallbladder wall
thickening or pericholecystic fluid.

Common bile duct:  Within normal limits in caliber. Measures 4 mm
in diameter.

Liver:  Diffusely increased parenchymal echogenicity, consistent
with hepatic steatosis.  No focal liver mass identified.
IMPRESSION: 1.  Cholelithiasis.  No sonographic signs of acute cholecystitis or
biliary dilatation.
2.  Hepatic steatosis.

## 2014-01-12 ENCOUNTER — Encounter (HOSPITAL_COMMUNITY): Payer: Self-pay | Admitting: *Deleted

## 2014-10-07 ENCOUNTER — Encounter (HOSPITAL_COMMUNITY): Payer: Self-pay | Admitting: *Deleted

## 2021-09-18 ENCOUNTER — Ambulatory Visit: Payer: Self-pay | Admitting: Surgery

## 2021-09-18 NOTE — H&P (View-Only) (Signed)
   Wanda Mccann [id number]   Referring Provider:  Drue Flirt, MD   Subjective   Chief Complaint: cyst on back     History of Present Illness: Very pleasant and otherwise healthy young woman with a mass in the mid back which has been present for quite some time.  Initially it was not bothersome, but recently has become more so whenever she leans on this area when sitting certain ways or in a bathtub for example.  She is interested in having this removed.  No previous excision or drainage attempts.  Review of Systems: A complete review of systems was obtained from the patient.  I have reviewed this information and discussed as appropriate with the patient.  See HPI as well for other ROS.   Medical History: History reviewed. No pertinent past medical history.  There is no problem list on file for this patient.   Past Surgical History:  Procedure Laterality Date   REPEAT CESAREAN SECTION       Allergies  Allergen Reactions   Penicillins Rash    Current Outpatient Medications on File Prior to Visit  Medication Sig Dispense Refill   ADDERALL XR 10 mg XR capsule      buPROPion (WELLBUTRIN XL) 300 MG XL tablet      No current facility-administered medications on file prior to visit.    Family History  Problem Relation Age of Onset   Obesity Father    Diabetes Father      Social History   Tobacco Use  Smoking Status Current Every Day Smoker  Smokeless Tobacco Never Used     Social History   Socioeconomic History   Marital status: Married  Tobacco Use   Smoking status: Current Every Day Smoker   Smokeless tobacco: Never Used  Scientific laboratory technician Use: Never used  Substance and Sexual Activity   Alcohol use: Yes   Drug use: Never    Objective:    Vitals:   09/18/21 1510  BP: 138/86  Pulse: (!) 113  Temp: 37.2 C (98.9 F)  SpO2: 98%  Weight: (!) 123.4 kg (272 lb)  Height: 172.7 cm (5\' 8" )    Body mass index is 41.36 kg/m.  Alert and  well-appearing Unlabored respirations In the mid back between the scapula more to the right of midline there is a mobile deeper subcutaneous mass approximately 6 cm in diameter.  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  Patient would like this removed.  We discussed the procedure including technical aspects, Risks of bleeding, infection, pain, scarring, wound healing problems, recurrence of the lesion.  Questions welcomed and answered.  We will schedule at her convenience  [name] Raquel James, MD

## 2021-09-18 NOTE — H&P (Signed)
   Wanda Mccann [id number]   Referring Provider:  Drue Flirt, MD   Subjective   Chief Complaint: cyst on back     History of Present Illness: Very pleasant and otherwise healthy young woman with a mass in the mid back which has been present for quite some time.  Initially it was not bothersome, but recently has become more so whenever she leans on this area when sitting certain ways or in a bathtub for example.  She is interested in having this removed.  No previous excision or drainage attempts.  Review of Systems: A complete review of systems was obtained from the patient.  I have reviewed this information and discussed as appropriate with the patient.  See HPI as well for other ROS.   Medical History: History reviewed. No pertinent past medical history.  There is no problem list on file for this patient.   Past Surgical History:  Procedure Laterality Date   REPEAT CESAREAN SECTION       Allergies  Allergen Reactions   Penicillins Rash    Current Outpatient Medications on File Prior to Visit  Medication Sig Dispense Refill   ADDERALL XR 10 mg XR capsule      buPROPion (WELLBUTRIN XL) 300 MG XL tablet      No current facility-administered medications on file prior to visit.    Family History  Problem Relation Age of Onset   Obesity Father    Diabetes Father      Social History   Tobacco Use  Smoking Status Current Every Day Smoker  Smokeless Tobacco Never Used     Social History   Socioeconomic History   Marital status: Married  Tobacco Use   Smoking status: Current Every Day Smoker   Smokeless tobacco: Never Used  Scientific laboratory technician Use: Never used  Substance and Sexual Activity   Alcohol use: Yes   Drug use: Never    Objective:    Vitals:   09/18/21 1510  BP: 138/86  Pulse: (!) 113  Temp: 37.2 C (98.9 F)  SpO2: 98%  Weight: (!) 123.4 kg (272 lb)  Height: 172.7 cm (5\' 8" )    Body mass index is 41.36 kg/m.  Alert and  well-appearing Unlabored respirations In the mid back between the scapula more to the right of midline there is a mobile deeper subcutaneous mass approximately 6 cm in diameter.  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  Patient would like this removed.  We discussed the procedure including technical aspects, Risks of bleeding, infection, pain, scarring, wound healing problems, recurrence of the lesion.  Questions welcomed and answered.  We will schedule at her convenience  Wanda Prestigiacomo Raquel James, MD

## 2021-10-14 ENCOUNTER — Other Ambulatory Visit: Payer: Self-pay

## 2021-10-14 ENCOUNTER — Encounter (HOSPITAL_BASED_OUTPATIENT_CLINIC_OR_DEPARTMENT_OTHER): Payer: Self-pay | Admitting: Surgery

## 2021-10-14 NOTE — Progress Notes (Signed)
Spoke w/ via phone for pre-op interview---pt Lab needs dos---- urine preg              Lab results------ no COVID test -----patient states asymptomatic no test needed Arrive at ------- 0900 on 10-16-2021 NPO after MN NO Solid Food.  Clear liquids from MN until--- 0800 Med rec completed Medications to take morning of surgery ----- welbutrin Diabetic medication ----- n/a Patient instructed no nail polish to be worn day of surgery Patient instructed to bring photo id and insurance card day of surgery Patient aware to have Driver (ride ) / caregiver for 24 hours after surgery --friend, cliff beckwith Patient Special Instructions ----- n/a Pre-Op special Istructions ----- n/a Patient verbalized understanding of instructions that were given at this phone interview. Patient denies shortness of breath, chest pain, fever, cough at this phone interview.

## 2021-10-16 ENCOUNTER — Ambulatory Visit (HOSPITAL_BASED_OUTPATIENT_CLINIC_OR_DEPARTMENT_OTHER)
Admission: RE | Admit: 2021-10-16 | Discharge: 2021-10-16 | Disposition: A | Discharge status: Home / Self Care | Payer: 59 | Attending: Surgery | Admitting: Surgery

## 2021-10-16 ENCOUNTER — Encounter (HOSPITAL_BASED_OUTPATIENT_CLINIC_OR_DEPARTMENT_OTHER): Payer: Self-pay | Admitting: Surgery

## 2021-10-16 ENCOUNTER — Other Ambulatory Visit: Payer: Self-pay

## 2021-10-16 ENCOUNTER — Ambulatory Visit (HOSPITAL_BASED_OUTPATIENT_CLINIC_OR_DEPARTMENT_OTHER): Payer: 59 | Admitting: Certified Registered"

## 2021-10-16 ENCOUNTER — Encounter (HOSPITAL_BASED_OUTPATIENT_CLINIC_OR_DEPARTMENT_OTHER)
Admission: RE | Disposition: A | Discharge status: Home / Self Care | Payer: Self-pay | Source: Home / Self Care | Attending: Surgery

## 2021-10-16 DIAGNOSIS — D171 Benign lipomatous neoplasm of skin and subcutaneous tissue of trunk: Secondary | ICD-10-CM | POA: Insufficient documentation

## 2021-10-16 DIAGNOSIS — F17200 Nicotine dependence, unspecified, uncomplicated: Secondary | ICD-10-CM | POA: Diagnosis not present

## 2021-10-16 DIAGNOSIS — R222 Localized swelling, mass and lump, trunk: Secondary | ICD-10-CM | POA: Diagnosis present

## 2021-10-16 HISTORY — DX: Depression, unspecified: F32.A

## 2021-10-16 HISTORY — DX: Presence of spectacles and contact lenses: Z97.3

## 2021-10-16 HISTORY — DX: Anxiety disorder, unspecified: F41.9

## 2021-10-16 HISTORY — DX: Attention-deficit hyperactivity disorder, unspecified type: F90.9

## 2021-10-16 HISTORY — DX: Localized swelling, mass and lump, trunk: R22.2

## 2021-10-16 HISTORY — PX: MASS EXCISION: SHX2000

## 2021-10-16 LAB — POCT PREGNANCY, URINE: Preg Test, Ur: NEGATIVE

## 2021-10-16 SURGERY — EXCISION MASS
Anesthesia: Monitor Anesthesia Care | Site: Back

## 2021-10-16 MED ORDER — ONDANSETRON HCL 4 MG/2ML IJ SOLN
INTRAMUSCULAR | Status: AC
  Filled 2021-10-16: qty 2

## 2021-10-16 MED ORDER — KETOROLAC TROMETHAMINE 30 MG/ML IJ SOLN
INTRAMUSCULAR | Status: AC
  Filled 2021-10-16: qty 1

## 2021-10-16 MED ORDER — CEFAZOLIN SODIUM-DEXTROSE 2-4 GM/100ML-% IV SOLN
2.0000 g | INTRAVENOUS | Status: AC
  Administered 2021-10-16: 2 g via INTRAVENOUS

## 2021-10-16 MED ORDER — MIDAZOLAM HCL 2 MG/2ML IJ SOLN
INTRAMUSCULAR | Status: AC
  Filled 2021-10-16: qty 2

## 2021-10-16 MED ORDER — KETAMINE HCL 10 MG/ML IJ SOLN
INTRAMUSCULAR | Status: DC | PRN
  Administered 2021-10-16 (×5): 10 mg via INTRAVENOUS

## 2021-10-16 MED ORDER — OXYCODONE HCL 5 MG/5ML PO SOLN: 5.0000 mg | Freq: Once | ORAL | Status: DC | PRN

## 2021-10-16 MED ORDER — CHLORHEXIDINE GLUCONATE 4 % EX LIQD: 60.0000 mL | Freq: Once | CUTANEOUS | Status: DC

## 2021-10-16 MED ORDER — TRAMADOL HCL 50 MG PO TABS: 50.0000 mg | ORAL_TABLET | Freq: Four times a day (QID) | ORAL | 0 refills | Status: AC | PRN

## 2021-10-16 MED ORDER — KETAMINE HCL 50 MG/5ML IJ SOSY
PREFILLED_SYRINGE | INTRAMUSCULAR | Status: AC
  Filled 2021-10-16: qty 5

## 2021-10-16 MED ORDER — PROPOFOL 500 MG/50ML IV EMUL
INTRAVENOUS | Status: DC | PRN
  Administered 2021-10-16: 100 ug/kg/min via INTRAVENOUS

## 2021-10-16 MED ORDER — ONDANSETRON HCL 4 MG/2ML IJ SOLN
INTRAMUSCULAR | Status: DC | PRN
  Administered 2021-10-16: 8 mg via INTRAVENOUS

## 2021-10-16 MED ORDER — DEXMEDETOMIDINE (PRECEDEX) IN NS 20 MCG/5ML (4 MCG/ML) IV SYRINGE
PREFILLED_SYRINGE | INTRAVENOUS | Status: AC
  Filled 2021-10-16: qty 5

## 2021-10-16 MED ORDER — ACETAMINOPHEN 500 MG PO TABS
1000.0000 mg | ORAL_TABLET | ORAL | Status: AC
  Administered 2021-10-16: 1000 mg via ORAL

## 2021-10-16 MED ORDER — OXYCODONE HCL 5 MG PO TABS: 5.0000 mg | ORAL_TABLET | Freq: Once | ORAL | Status: DC | PRN

## 2021-10-16 MED ORDER — LIDOCAINE HCL (CARDIAC) PF 100 MG/5ML IV SOSY
PREFILLED_SYRINGE | INTRAVENOUS | Status: DC | PRN
  Administered 2021-10-16: 60 mg via INTRAVENOUS

## 2021-10-16 MED ORDER — LACTATED RINGERS IV SOLN: INTRAVENOUS | Status: DC

## 2021-10-16 MED ORDER — MIDAZOLAM HCL 5 MG/5ML IJ SOLN
INTRAMUSCULAR | Status: DC | PRN
  Administered 2021-10-16: 2 mg via INTRAVENOUS
  Administered 2021-10-16 (×2): 1 mg via INTRAVENOUS

## 2021-10-16 MED ORDER — PROPOFOL 10 MG/ML IV BOLUS
INTRAVENOUS | Status: DC | PRN
  Administered 2021-10-16: 30 mg via INTRAVENOUS
  Administered 2021-10-16 (×3): 20 mg via INTRAVENOUS

## 2021-10-16 MED ORDER — KETOROLAC TROMETHAMINE 30 MG/ML IJ SOLN: 30.0000 mg | Freq: Once | INTRAMUSCULAR | Status: DC | PRN

## 2021-10-16 MED ORDER — FENTANYL CITRATE (PF) 100 MCG/2ML IJ SOLN
INTRAMUSCULAR | Status: DC | PRN
  Administered 2021-10-16 (×4): 25 ug via INTRAVENOUS

## 2021-10-16 MED ORDER — BUPIVACAINE LIPOSOME 1.3 % IJ SUSP: 20.0000 mL | Freq: Once | INTRAMUSCULAR | Status: DC

## 2021-10-16 MED ORDER — CEFAZOLIN SODIUM-DEXTROSE 2-4 GM/100ML-% IV SOLN
INTRAVENOUS | Status: AC
  Filled 2021-10-16: qty 100

## 2021-10-16 MED ORDER — FENTANYL CITRATE (PF) 100 MCG/2ML IJ SOLN
INTRAMUSCULAR | Status: AC
  Filled 2021-10-16: qty 2

## 2021-10-16 MED ORDER — ONDANSETRON HCL 4 MG/2ML IJ SOLN: 4.0000 mg | Freq: Once | INTRAMUSCULAR | Status: DC | PRN

## 2021-10-16 MED ORDER — DEXMEDETOMIDINE (PRECEDEX) IN NS 20 MCG/5ML (4 MCG/ML) IV SYRINGE
PREFILLED_SYRINGE | INTRAVENOUS | Status: DC | PRN
  Administered 2021-10-16 (×7): 8 ug via INTRAVENOUS
  Administered 2021-10-16: 12 ug via INTRAVENOUS

## 2021-10-16 MED ORDER — PROPOFOL 10 MG/ML IV BOLUS
INTRAVENOUS | Status: AC
  Filled 2021-10-16: qty 20

## 2021-10-16 MED ORDER — DEXAMETHASONE SODIUM PHOSPHATE 4 MG/ML IJ SOLN
INTRAMUSCULAR | Status: DC | PRN
  Administered 2021-10-16: 5 mg via INTRAVENOUS

## 2021-10-16 MED ORDER — 0.9 % SODIUM CHLORIDE (POUR BTL) OPTIME
TOPICAL | Status: DC | PRN
  Administered 2021-10-16: 500 mL

## 2021-10-16 MED ORDER — BUPIVACAINE-EPINEPHRINE 0.25% -1:200000 IJ SOLN
INTRAMUSCULAR | Status: DC | PRN
  Administered 2021-10-16: 30 mL

## 2021-10-16 MED ORDER — KETOROLAC TROMETHAMINE 30 MG/ML IJ SOLN
INTRAMUSCULAR | Status: DC | PRN
  Administered 2021-10-16: 30 mg via INTRAVENOUS

## 2021-10-16 MED ORDER — PROPOFOL 500 MG/50ML IV EMUL
INTRAVENOUS | Status: AC
  Filled 2021-10-16: qty 50

## 2021-10-16 MED ORDER — FENTANYL CITRATE (PF) 100 MCG/2ML IJ SOLN: 25.0000 ug | INTRAMUSCULAR | Status: DC | PRN

## 2021-10-16 MED ORDER — DEXAMETHASONE SODIUM PHOSPHATE 10 MG/ML IJ SOLN
INTRAMUSCULAR | Status: AC
  Filled 2021-10-16: qty 1

## 2021-10-16 MED ORDER — ACETAMINOPHEN 500 MG PO TABS
ORAL_TABLET | ORAL | Status: AC
  Filled 2021-10-16: qty 2

## 2021-10-16 MED ORDER — LIDOCAINE 2% (20 MG/ML) 5 ML SYRINGE
INTRAMUSCULAR | Status: AC
  Filled 2021-10-16: qty 5

## 2021-10-16 SURGICAL SUPPLY — 46 items
ADH SKN CLS APL DERMABOND .7 (GAUZE/BANDAGES/DRESSINGS) ×1
APL PRP STRL LF DISP 70% ISPRP (MISCELLANEOUS)
BLADE SURG 15 STRL LF DISP TIS (BLADE) ×1 IMPLANT
BLADE SURG 15 STRL SS (BLADE) ×2
CHLORAPREP W/TINT 26 (MISCELLANEOUS) IMPLANT
COVER BACK TABLE 60X90IN (DRAPES) ×2 IMPLANT
COVER MAYO STAND STRL (DRAPES) ×2 IMPLANT
DERMABOND ADVANCED (GAUZE/BANDAGES/DRESSINGS) ×1
DERMABOND ADVANCED .7 DNX12 (GAUZE/BANDAGES/DRESSINGS) IMPLANT
DRAPE LAPAROTOMY 100X72 PEDS (DRAPES) IMPLANT
DRAPE LAPAROTOMY TRNSV 102X78 (DRAPES) IMPLANT
DRAPE UTILITY XL STRL (DRAPES) ×2 IMPLANT
DRSG PAD ABDOMINAL 8X10 ST (GAUZE/BANDAGES/DRESSINGS) IMPLANT
ELECT REM PT RETURN 9FT ADLT (ELECTROSURGICAL) ×2
ELECTRODE REM PT RTRN 9FT ADLT (ELECTROSURGICAL) ×1 IMPLANT
GAUZE 4X4 16PLY ~~LOC~~+RFID DBL (SPONGE) ×2 IMPLANT
GAUZE SPONGE 4X4 12PLY STRL (GAUZE/BANDAGES/DRESSINGS) ×2 IMPLANT
GLOVE SURG ENC MOIS LTX SZ6 (GLOVE) ×2 IMPLANT
GLOVE SURG UNDER LTX SZ6.5 (GLOVE) ×2 IMPLANT
GOWN STRL REUS W/TWL LRG LVL3 (GOWN DISPOSABLE) ×2 IMPLANT
KIT SIGMOIDOSCOPE (SET/KITS/TRAYS/PACK) IMPLANT
KIT TURNOVER CYSTO (KITS) ×2 IMPLANT
NDL HYPO 25X1 1.5 SAFETY (NEEDLE) IMPLANT
NEEDLE HYPO 22GX1.5 SAFETY (NEEDLE) IMPLANT
NEEDLE HYPO 25X1 1.5 SAFETY (NEEDLE) IMPLANT
PACK BASIN DAY SURGERY FS (CUSTOM PROCEDURE TRAY) ×2 IMPLANT
PANTS MESH DISP LRG (UNDERPADS AND DIAPERS) IMPLANT
PANTS MESH DISPOSABLE L (UNDERPADS AND DIAPERS)
PENCIL SMOKE EVACUATOR (MISCELLANEOUS) ×2 IMPLANT
SPONGE T-LAP 18X18 ~~LOC~~+RFID (SPONGE) IMPLANT
SPONGE T-LAP 4X18 ~~LOC~~+RFID (SPONGE) IMPLANT
STAPLER VISISTAT 35W (STAPLE) IMPLANT
SUCTION FRAZIER HANDLE 10FR (MISCELLANEOUS)
SUCTION TUBE FRAZIER 10FR DISP (MISCELLANEOUS) IMPLANT
SUT CHROMIC 3 0 SH 27 (SUTURE) IMPLANT
SUT MNCRL AB 4-0 PS2 18 (SUTURE) ×2 IMPLANT
SUT VIC AB 2-0 SH 27 (SUTURE)
SUT VIC AB 2-0 SH 27XBRD (SUTURE) IMPLANT
SUT VIC AB 3-0 SH 27 (SUTURE) ×2
SUT VIC AB 3-0 SH 27X BRD (SUTURE) ×1 IMPLANT
SYR BULB IRRIG 60ML STRL (SYRINGE) ×2 IMPLANT
SYR CONTROL 10ML LL (SYRINGE) ×2 IMPLANT
TOWEL OR 17X26 10 PK STRL BLUE (TOWEL DISPOSABLE) ×2 IMPLANT
TRAY DSU PREP LF (CUSTOM PROCEDURE TRAY) IMPLANT
TUBE CONNECTING 12X1/4 (SUCTIONS) ×2 IMPLANT
YANKAUER SUCT BULB TIP NO VENT (SUCTIONS) ×2 IMPLANT

## 2021-10-16 NOTE — Interval H&P Note (Signed)
History and Physical Interval Note:  10/16/2021 9:02 AM  Wanda Mccann  has presented today for surgery, with the diagnosis of SUBCUTANEOUS MASS UPPER BACK.  The various methods of treatment have been discussed with the patient and family. After consideration of risks, benefits and other options for treatment, the patient has consented to  Procedure(s): EXCISION OF SUBCUTANEOUS MASS OF UPPER BACK (N/A) as a surgical intervention.  The patient's history has been reviewed, patient examined, no change in status, stable for surgery.  I have reviewed the patient's chart and labs.  Questions were answered to the patient's satisfaction.     Koichi Platte Rich Brave

## 2021-10-16 NOTE — Discharge Instructions (Addendum)
GENERAL SURGERY: POST OP INSTRUCTIONS  EAT Gradually transition to a high fiber diet with a fiber supplement over the next few weeks after discharge.  Start with a pureed / full liquid diet (see below)  WALK Walk an hour a day (cumulative, not all at once).  Control your pain to do that.    CONTROL PAIN Control pain so that you can walk, sleep, tolerate sneezing/coughing, go up/down stairs.  HAVE A BOWEL MOVEMENT DAILY Keep your bowels regular to avoid problems.  OK to try a laxative to override constipation.  OK to use an antidairrheal to slow down diarrhea.  Call if not better after 2 tries  CALL IF YOU HAVE PROBLEMS/CONCERNS Call if you are still struggling despite following these instructions. Call if you have concerns not answered by these instructions    DIET: Follow a light bland diet & liquids the first 24 hours after arrival home, such as soup, liquids, starches, etc.  Be sure to drink plenty of fluids.  Quickly advance to a usual solid diet within a few days.  Avoid fast food or heavy meals as your are more likely to get nauseated or have irregular bowels.  A low-sugar, high-fiber diet for the rest of your life is ideal.   Take your usually prescribed home medications unless otherwise directed. PAIN CONTROL: Pain is best controlled by a usual combination of three different methods TOGETHER: Ice/Heat Over the counter pain medication Prescription pain medication Most patients will experience some swelling and bruising around the incisions.  Ice packs or heating pads (30-60 minutes up to 6 times a day) will help. Use ice for the first few days to help decrease swelling and bruising, then switch to heat to help relax tight/sore spots and speed recovery.  Some people prefer to use ice alone, heat alone, alternating between ice & heat.  Experiment to what works for you.  Swelling and bruising can take several weeks to resolve.   It is helpful to take an over-the-counter pain  medication regularly for the first few weeks.  Choose one of the following that works best for you: Naproxen (Aleve, etc)  Two 220mg  tabs twice a day OR Ibuprofen (Advil, etc) Three 200mg  tabs four times a day (every meal & bedtime) AND Acetaminophen (Tylenol, etc) 500-650mg  four times a day (every meal & bedtime) A  prescription for pain medication (such as oxycodone, hydrocodone, etc) should be given to you upon discharge.  Take your pain medication as prescribed.  If you are having problems/concerns with the prescription medicine (does not control pain, nausea, vomiting, rash, itching, etc), please call us [phone] to see if we need to switch you to a different pain medicine that will work better for you and/or control your side effect better. If you need a refill on your pain medication, please contact your pharmacy.  They will contact our office to request authorization. Prescriptions will not be filled after 5 pm or on week-ends. Avoid getting constipated.  Between the surgery and the pain medications, it is common to experience some constipation.  Increasing fluid intake and taking a fiber supplement (such as Metamucil, Citrucel, FiberCon, MiraLax, etc) 1-2 times a day regularly will usually help prevent this problem from occurring.  A mild laxative (prune juice, Milk of Magnesia, MiraLax, etc) should be taken according to package directions if there are no bowel movements after 48 hours.   Wash / shower every day, starting 2 days after surgery.  You may shower over the  skin glue which is waterproof.  No rubbing, scrubbing, lotions or ointments to incision.  Continue to shower over incision(s) after the dressing is off. Glue will flake off after 1 to 2 weeks.  You may leave the incision open to air.  You may have skin tapes (Steri Strips) covering the incision(s).  Leave them on until one week, then remove.  You may replace a dressing/Band-Aid to cover the incision for comfort if you wish.       ACTIVITIES as tolerated:   You may resume regular (light) daily activities beginning the next day--such as daily self-care, walking, climbing stairs--gradually increasing activities as tolerated.  If you can walk 30 minutes without difficulty, it is safe to try more intense activity such as jogging, treadmill, bicycling, low-impact aerobics, swimming, etc. Save the most intensive and strenuous activity for last such as sit-ups, heavy lifting, contact sports, etc  Refrain from any heavy lifting or straining until you are off narcotics for pain control.   DO NOT PUSH THROUGH PAIN.  Let pain be your guide: If it hurts to do something, don't do it.  Pain is your body warning you to avoid that activity for another week until the pain goes down. You may drive when you are no longer taking prescription pain medication, you can comfortably wear a seatbelt, and you can safely maneuver your car and apply brakes. You may have sexual intercourse when it is comfortable.  FOLLOW UP in our office Please call [facility] at [phone] to set up an appointment to see your surgeon in the office for a follow-up appointment approximately 2-3 weeks after your surgery. Make sure that you call for this appointment the day you arrive home to insure a convenient appointment time. 9. IF YOU HAVE DISABILITY OR FAMILY LEAVE FORMS, BRING THEM TO THE OFFICE FOR PROCESSING.  DO NOT GIVE THEM TO YOUR DOCTOR.   WHEN TO CALL us [phone]: Poor pain control Reactions / problems with new medications (rash/itching, nausea, etc)  Fever over 101.5 F (38.5 C) Worsening swelling or bruising Continued bleeding from incision. Increased pain, redness, or drainage from the incision Difficulty breathing / swallowing   The clinic staff is available to answer your questions during regular business hours (8:30am-5pm).  Please don't hesitate to call and ask to speak to one of our nurses for clinical concerns.   If you have a medical  emergency, go to the nearest emergency room or call 911.  A surgeon from [facility] is always on call at the [facility], [address] [place], [place], [place], [place]  [address] ? MAIN: [phone] ? TOLL FREE: [phone] ?  FAX [phone] [email or web address]     NO IBUPROFEN CONTAINING PRODUCTS UNTIL AFTER 5:45 PM TODAY. (ADVIL, MOTRIN, ALEVE, NAPROXEN)     Post Anesthesia Home Care Instructions  Activity: Get plenty of rest for the remainder of the day. A responsible individual must stay with you for 24 hours following the procedure.  For the next 24 hours, DO NOT: -Drive a car -Paediatric nurse -Drink alcoholic beverages -Take any medication unless instructed by your physician -Make any legal decisions or sign important papers.  Meals: Start with liquid foods such as gelatin or soup. Progress to regular foods as tolerated. Avoid greasy, spicy, heavy foods. If nausea and/or vomiting occur, drink only clear liquids until the nausea and/or vomiting subsides. Call your physician if vomiting continues.  Special Instructions/Symptoms: Your throat may feel  dry or sore from the anesthesia or the breathing tube placed in your throat during surgery. If this causes discomfort, gargle with warm salt water. The discomfort should disappear within 24 hours.

## 2021-10-16 NOTE — Anesthesia Procedure Notes (Signed)
Procedure Name: MAC Date/Time: 10/16/2021 11:08 AM Performed by: Justice Rocher, CRNA Pre-anesthesia Checklist: Timeout performed, Patient being monitored, Suction available, Emergency Drugs available and Patient identified Patient Re-evaluated:Patient Re-evaluated prior to induction Oxygen Delivery Method: Simple face mask Preoxygenation: Pre-oxygenation with 100% oxygen Induction Type: IV induction Placement Confirmation: breath sounds checked- equal and bilateral, CO2 detector and positive ETCO2

## 2021-10-16 NOTE — Transfer of Care (Signed)
Immediate Anesthesia Transfer of Care Note  Patient: Wanda Mccann  Procedure(s) Performed: Procedure(s) (LRB): EXCISION OF SUBCUTANEOUS MASS OF UPPER BACK (N/A)  Patient Location: PACU  Anesthesia Type: MAC  Level of Consciousness: awake, sedated, patient cooperative and responds to stimulation  Airway & Oxygen Therapy: Patient Spontanous Breathing and Patient connected to face mask oxygen  Post-op Assessment: Report given to PACU RN, Post -op Vital signs reviewed and stable and Patient moving all extremities  Post vital signs: Reviewed and stable  Complications: No apparent anesthesia complications

## 2021-10-16 NOTE — Anesthesia Postprocedure Evaluation (Signed)
Anesthesia Post Note  Patient: Wanda Mccann  Procedure(s) Performed: EXCISION OF SUBCUTANEOUS MASS OF UPPER BACK (Back)     Patient location during evaluation: PACU Anesthesia Type: MAC Level of consciousness: awake and alert Pain management: pain level controlled Vital Signs Assessment: post-procedure vital signs reviewed and stable Respiratory status: spontaneous breathing, nonlabored ventilation, respiratory function stable and patient connected to nasal cannula oxygen Cardiovascular status: stable and blood pressure returned to baseline Postop Assessment: no apparent nausea or vomiting Anesthetic complications: no   No notable events documented.  Last Vitals:  Vitals:   10/16/21 1200 10/16/21 1215  BP: 106/77 111/63  Pulse: (!) 54 (!) 51  Resp: 14 18  Temp: 36.4 C   SpO2: 100% 99%    Last Pain:  Vitals:   10/16/21 1200  TempSrc:   PainSc: 0-No pain                 [name] S

## 2021-10-16 NOTE — Op Note (Signed)
Operative Note  Wanda Mccann  [id number]  [id number]  10/16/2021   Surgeon: Romana Juniper MD FACS   Procedure performed: Excision of subcutaneous mass, 8 x 5 x 3 cm, upper back   Preop diagnosis: Subcutaneous mass Post-op diagnosis/intraop findings: Same, extending into the fascia   Specimens: Upper back subcutaneous mass Retained items: no  EBL: 5cc Complications: none   Description of procedure: After obtaining informed consent the patient was taken to the operating room and placed prone on operating room table where MAC was initiated, preoperative antibiotics were administered, SCDs applied, and a formal timeout was performed.  The skin of the upper back was prepped in the usual sterile fashion.  After infiltration with local (quarter percent Marcaine with epinephrine) an incision was made and cautery was used to dissect through the soft tissues until the surface of the mass was encountered.  A combination of blunt dissection and cautery was used to free the mass from its attachments to the surrounding soft tissue.  The mass was noted to extend into the fascia but did not seem to go deep to this.  The mass was excised intact and handed off for pathology.  The wound was inspected and hemostasis ensured with cautery.  The incision was closed with interrupted deep dermal 3-0 Vicryl followed by running subcuticular 4-0 Monocryl and Dermabond.  The patient was then awakened, returned to the supine position and taken to PACU in stable condition.    All counts were correct at the completion of the case.

## 2021-10-16 NOTE — Anesthesia Preprocedure Evaluation (Addendum)
Anesthesia Evaluation  Patient identified by MRN, date of birth, ID band Patient awake    Reviewed: Allergy & Precautions, NPO status , Patient's Chart, lab work & pertinent test results  Airway Mallampati: II  TM Distance: >3 FB Neck ROM: Full    Dental no notable dental hx.    Pulmonary neg pulmonary ROS, former smoker,    Pulmonary exam normal breath sounds clear to auscultation       Cardiovascular negative cardio ROS Normal cardiovascular exam Rhythm:Regular Rate:Normal     Neuro/Psych Anxiety negative neurological ROS     GI/Hepatic negative GI ROS, Neg liver ROS,   Endo/Other  Morbid obesity  Renal/GU negative Renal ROS  negative genitourinary   Musculoskeletal negative musculoskeletal ROS (+)   Abdominal   Peds negative pediatric ROS (+)  Hematology negative hematology ROS (+)   Anesthesia Other Findings   Reproductive/Obstetrics negative OB ROS                             Anesthesia Physical Anesthesia Plan  ASA: 2  Anesthesia Plan: MAC   Post-op Pain Management:    Induction: Intravenous  PONV Risk Score and Plan: 2 and Propofol infusion, Ondansetron, Treatment may vary due to age or medical condition and Midazolam  Airway Management Planned: Simple Face Mask  Additional Equipment:   Intra-op Plan:   Post-operative Plan: Extubation in OR  Informed Consent: I have reviewed the patients History and Physical, chart, labs and discussed the procedure including the risks, benefits and alternatives for the proposed anesthesia with the patient or authorized representative who has indicated his/her understanding and acceptance.     Dental advisory given  Plan Discussed with: CRNA and Surgeon  Anesthesia Plan Comments:        Anesthesia Quick Evaluation

## 2021-10-19 ENCOUNTER — Encounter (HOSPITAL_BASED_OUTPATIENT_CLINIC_OR_DEPARTMENT_OTHER): Payer: Self-pay | Admitting: Surgery

## 2021-10-19 LAB — SURGICAL PATHOLOGY

## 2021-10-19 NOTE — Addendum Note (Signed)
Addendum  created 10/19/21 0959 by Justice Rocher, CRNA   Charge Capture section accepted

## 2022-02-23 ENCOUNTER — Encounter: Payer: Self-pay | Admitting: General Practice
